# Patient Record
Sex: Female | Born: 1960
Health system: Southern US, Community
[De-identification: ages and names within clinical notes are randomized; demographics above are authoritative.]

## PROBLEM LIST (undated history)

## (undated) DIAGNOSIS — N904 Leukoplakia of vulva: Secondary | ICD-10-CM

## (undated) DIAGNOSIS — E7800 Pure hypercholesterolemia, unspecified: Secondary | ICD-10-CM

## (undated) DIAGNOSIS — N920 Excessive and frequent menstruation with regular cycle: Secondary | ICD-10-CM

## (undated) DIAGNOSIS — N800 Endometriosis of uterus: Secondary | ICD-10-CM

## (undated) DIAGNOSIS — Z8639 Personal history of other endocrine, nutritional and metabolic disease: Secondary | ICD-10-CM

## (undated) DIAGNOSIS — N8003 Adenomyosis of the uterus: Secondary | ICD-10-CM

## (undated) DIAGNOSIS — D219 Benign neoplasm of connective and other soft tissue, unspecified: Secondary | ICD-10-CM

## (undated) DIAGNOSIS — N809 Endometriosis, unspecified: Secondary | ICD-10-CM

## (undated) DIAGNOSIS — G43909 Migraine, unspecified, not intractable, without status migrainosus: Secondary | ICD-10-CM

## (undated) DIAGNOSIS — I493 Ventricular premature depolarization: Secondary | ICD-10-CM

## (undated) HISTORY — DX: Pure hypercholesterolemia, unspecified: E78.00

## (undated) HISTORY — PX: TUBAL LIGATION: SHX77

## (undated) HISTORY — DX: Benign neoplasm of connective and other soft tissue, unspecified: D21.9

## (undated) HISTORY — PX: INTRAUTERINE DEVICE (IUD) INSERTION: SHX5877

## (undated) HISTORY — DX: Endometriosis, unspecified: N80.9

## (undated) HISTORY — DX: Ventricular premature depolarization: I49.3

## (undated) HISTORY — DX: Excessive and frequent menstruation with regular cycle: N92.0

## (undated) HISTORY — DX: Personal history of other endocrine, nutritional and metabolic disease: Z86.39

## (undated) HISTORY — DX: Migraine, unspecified, not intractable, without status migrainosus: G43.909

## (undated) HISTORY — DX: Endometriosis of uterus: N80.0

## (undated) HISTORY — DX: Leukoplakia of vulva: N90.4

## (undated) HISTORY — DX: Adenomyosis of the uterus: N80.03

---

## 1991-12-14 HISTORY — PX: DILATION AND CURETTAGE OF UTERUS: SHX78

## 2004-12-13 HISTORY — PX: BREAST BIOPSY: SHX20

## 2005-12-13 HISTORY — PX: BREAST EXCISIONAL BIOPSY: SUR124

## 2009-07-02 ENCOUNTER — Ambulatory Visit: Payer: Self-pay | Admitting: Family Medicine

## 2009-07-02 DIAGNOSIS — J209 Acute bronchitis, unspecified: Secondary | ICD-10-CM

## 2009-08-15 ENCOUNTER — Encounter: Admission: RE | Admit: 2009-08-15 | Discharge: 2009-08-15 | Payer: Self-pay | Admitting: Internal Medicine

## 2009-08-29 ENCOUNTER — Encounter: Admission: RE | Admit: 2009-08-29 | Discharge: 2009-08-29 | Payer: Self-pay | Admitting: Internal Medicine

## 2010-01-26 ENCOUNTER — Ambulatory Visit (HOSPITAL_BASED_OUTPATIENT_CLINIC_OR_DEPARTMENT_OTHER): Admission: RE | Admit: 2010-01-26 | Discharge: 2010-01-26 | Payer: Self-pay | Admitting: Obstetrics and Gynecology

## 2010-11-10 DIAGNOSIS — J302 Other seasonal allergic rhinitis: Secondary | ICD-10-CM | POA: Insufficient documentation

## 2010-11-16 ENCOUNTER — Encounter: Admission: RE | Admit: 2010-11-16 | Discharge: 2010-11-16 | Payer: Self-pay | Admitting: Internal Medicine

## 2011-03-04 LAB — CBC
RBC: 4.61 MIL/uL (ref 3.87–5.11)
WBC: 8.2 10*3/uL (ref 4.0–10.5)

## 2011-03-04 LAB — HCG, SERUM, QUALITATIVE: Preg, Serum: NEGATIVE

## 2011-11-08 ENCOUNTER — Other Ambulatory Visit: Payer: Self-pay | Admitting: Internal Medicine

## 2011-11-08 DIAGNOSIS — Z1231 Encounter for screening mammogram for malignant neoplasm of breast: Secondary | ICD-10-CM

## 2011-11-23 ENCOUNTER — Ambulatory Visit
Admission: RE | Admit: 2011-11-23 | Discharge: 2011-11-23 | Disposition: A | Discharge status: Home / Self Care | Payer: Private Health Insurance - Indemnity | Source: Ambulatory Visit | Attending: Internal Medicine | Admitting: Internal Medicine

## 2011-11-23 DIAGNOSIS — Z1231 Encounter for screening mammogram for malignant neoplasm of breast: Secondary | ICD-10-CM

## 2012-10-31 ENCOUNTER — Other Ambulatory Visit: Payer: Self-pay | Admitting: Internal Medicine

## 2012-10-31 DIAGNOSIS — Z1231 Encounter for screening mammogram for malignant neoplasm of breast: Secondary | ICD-10-CM

## 2012-12-11 ENCOUNTER — Ambulatory Visit
Admission: RE | Admit: 2012-12-11 | Discharge: 2012-12-11 | Disposition: A | Discharge status: Home / Self Care | Payer: Managed Care, Other (non HMO) | Source: Ambulatory Visit | Attending: Internal Medicine | Admitting: Internal Medicine

## 2012-12-11 DIAGNOSIS — Z1231 Encounter for screening mammogram for malignant neoplasm of breast: Secondary | ICD-10-CM

## 2013-11-16 ENCOUNTER — Other Ambulatory Visit: Payer: Self-pay

## 2013-11-16 DIAGNOSIS — Z1231 Encounter for screening mammogram for malignant neoplasm of breast: Secondary | ICD-10-CM

## 2013-11-20 ENCOUNTER — Ambulatory Visit: Payer: Self-pay | Admitting: Certified Nurse Midwife

## 2013-11-22 ENCOUNTER — Encounter: Payer: Self-pay | Admitting: Certified Nurse Midwife

## 2013-11-23 ENCOUNTER — Ambulatory Visit (INDEPENDENT_AMBULATORY_CARE_PROVIDER_SITE_OTHER): Payer: BC Managed Care – PPO | Admitting: Certified Nurse Midwife

## 2013-11-23 ENCOUNTER — Encounter: Payer: Self-pay | Admitting: Certified Nurse Midwife

## 2013-11-23 VITALS — BP 110/64 | HR 64 | Resp 16 | Ht 67.5 in | Wt 233.0 lb

## 2013-11-23 DIAGNOSIS — Z01419 Encounter for gynecological examination (general) (routine) without abnormal findings: Secondary | ICD-10-CM

## 2013-11-23 DIAGNOSIS — Z800 Family history of malignant neoplasm of digestive organs: Secondary | ICD-10-CM

## 2013-11-23 NOTE — Progress Notes (Signed)
52 y.o. G46P1011 Married Caucasian Fe here for annual exam. Periods none since IUD insertion. Continues to have hip problems and needs evaluation.? Recommendation. Mother recently had Atrial fib episode with DVT and paralysis, patient would like to be proactive in preventing this for herself. Patient sees PCP for aex in 1/15. PCP does all labs. No other health issues today.  Patient's last menstrual period was 05/13/2012.          Sexually active: yes  The current method of family planning is IUD.    Exercising: no  exercise Smoker:  no  Health Maintenanceg Pap:  11-17-12 neg HPV HR net MMG:  12/13 Colonoscopy:  none BMD:   none TDaP:  Unsure will check with pcp Labs: none Self breast exam: done monthly   reports that she has never smoked. She does not have any smokeless tobacco history on file. She reports that she does not drink alcohol or use illicit drugs.  Past Medical History  Diagnosis Date  . Migraines     with aura  . Fibroid 01/13/11    2  . Menorrhagia   . Adenomyosis     Past Surgical History  Procedure Laterality Date  . Tubal ligation      BTSP  . Intrauterine device (iud) insertion      12/11  . Breast biopsy Right 2006    negative  . Dilation and curettage of uterus  1993    after SAB    Current Outpatient Prescriptions  Medication Sig Dispense Refill  . Chlorpheniramine Maleate (ALLERGY PO) Take by mouth daily.      . Cholecalciferol (VITAMIN D PO) Take by mouth daily.      . Multiple Vitamins-Minerals (MULTIVITAMIN PO) Take by mouth daily.      . naproxen sodium (ANAPROX) 220 MG tablet Take 220 mg by mouth as needed.       No current facility-administered medications for this visit.    Family History  Problem Relation Age of Onset  . Diabetes Mother   . Cancer Mother     colon  . Hypertension Mother   . Stroke Mother   . Heart disease Mother     Afib  . Hypertension Father   . Heart disease Father     CHF  . Diabetes Maternal Grandfather    . Heart attack Paternal Grandmother   . Heart attack Paternal Grandfather     ROS:  Pertinent items are noted in HPI.  Otherwise, a comprehensive ROS was negative.  Exam:   BP 110/64  Pulse 64  Resp 16  Ht 5' 7.5" (1.715 m)  Wt 233 lb (105.688 kg)  BMI 35.93 kg/m2  LMP 05/13/2012 Height: 5' 7.5" (171.5 cm)  Ht Readings from Last 3 Encounters:  11/23/13 5' 7.5" (1.715 m)  07/02/09 5\' 8"  (1.727 m)    General appearance: alert, cooperative and appears stated age Head: Normocephalic, without obvious abnormality, atraumatic Neck: no adenopathy, supple, symmetrical, trachea midline and thyroid normal to inspection and palpation and non-palpable Lungs: clear to auscultation bilaterally Breasts: normal appearance, no masses or tenderness, No nipple retraction or dimpling, No nipple discharge or bleeding, No axillary or supraclavicular adenopathy Heart: regular rate and rhythm Abdomen: soft, non-tender; no masses,  no organomegaly Extremities: extremities normal, atraumatic, no cyanosis or edema Skin: Skin color, texture, turgor normal. No rashes or lesions Lymph nodes: Cervical, supraclavicular, and axillary nodes normal. No abnormal inguinal nodes palpated Neurologic: Grossly normal   Pelvic: External genitalia:  no  lesions              Urethra:  normal appearing urethra with no masses, tenderness or lesions              Bartholin's and Skene's: normal                 Vagina: normal appearing vagina with normal color and discharge, no lesions              Cervix: normal, non tender IUD string noted              Pap taken: no Bimanual Exam:  Uterus:  normal and nodular feel posterior c/w history of fibroid.Non tender, mid position.              Adnexa: normal adnexa and no mass, fullness, tenderness               Rectovaginal: Confirms               Anus:  normal sphincter tone, no lesions  A:  Well Woman with normal exam  Contraception Mirena IUD  Right hip pain with slight  foot drag needs evaluation  Family history of DVT and CVD, patient desires evaluation  Family history of colon cancer, mother  P:   Reviewed health and wellness pertinent to exam  Removal due 11/2015  Given information to call orthopedic and schedule appointment for evaluation, if patient has problem with scheduling she will call  Patient to discuss with PCP for evaluation recommendations  Pap smear as per guidelines   Mammogram yearly pap smear not taken today Colonoscopy due, requests referral  counseled on breast self exam, mammography screening, adequate intake of calcium and vitamin D, diet and exercise  return annually or prn  An After Visit Summary was printed and given to the patient.

## 2013-11-23 NOTE — Patient Instructions (Signed)

## 2013-11-28 NOTE — Progress Notes (Signed)
Reviewed personally.  M. Suzanne Elsworth Ledin, MD.  

## 2013-12-28 ENCOUNTER — Ambulatory Visit
Admission: RE | Admit: 2013-12-28 | Discharge: 2013-12-28 | Disposition: A | Discharge status: Home / Self Care | Payer: 59 | Source: Ambulatory Visit

## 2013-12-28 DIAGNOSIS — Z1231 Encounter for screening mammogram for malignant neoplasm of breast: Secondary | ICD-10-CM

## 2014-01-01 ENCOUNTER — Other Ambulatory Visit: Payer: Self-pay | Admitting: Certified Nurse Midwife

## 2014-01-01 DIAGNOSIS — R928 Other abnormal and inconclusive findings on diagnostic imaging of breast: Secondary | ICD-10-CM

## 2014-01-08 ENCOUNTER — Other Ambulatory Visit: Payer: Self-pay | Admitting: Certified Nurse Midwife

## 2014-01-08 ENCOUNTER — Ambulatory Visit
Admission: RE | Admit: 2014-01-08 | Discharge: 2014-01-08 | Disposition: A | Discharge status: Home / Self Care | Payer: 59 | Source: Ambulatory Visit | Attending: Certified Nurse Midwife | Admitting: Certified Nurse Midwife

## 2014-01-08 DIAGNOSIS — R928 Other abnormal and inconclusive findings on diagnostic imaging of breast: Secondary | ICD-10-CM

## 2014-01-23 DIAGNOSIS — E8881 Metabolic syndrome: Secondary | ICD-10-CM | POA: Insufficient documentation

## 2014-06-07 ENCOUNTER — Other Ambulatory Visit: Payer: Self-pay | Admitting: Certified Nurse Midwife

## 2014-06-07 DIAGNOSIS — N63 Unspecified lump in unspecified breast: Secondary | ICD-10-CM

## 2014-06-20 ENCOUNTER — Other Ambulatory Visit: Payer: Self-pay

## 2014-06-20 ENCOUNTER — Other Ambulatory Visit: Payer: Self-pay | Admitting: Certified Nurse Midwife

## 2014-06-20 DIAGNOSIS — N63 Unspecified lump in unspecified breast: Secondary | ICD-10-CM

## 2014-06-21 ENCOUNTER — Ambulatory Visit
Admission: RE | Admit: 2014-06-21 | Discharge: 2014-06-21 | Disposition: A | Discharge status: Home / Self Care | Payer: 59 | Source: Ambulatory Visit | Attending: Certified Nurse Midwife | Admitting: Certified Nurse Midwife

## 2014-06-21 DIAGNOSIS — N63 Unspecified lump in unspecified breast: Secondary | ICD-10-CM

## 2014-06-28 ENCOUNTER — Encounter: Payer: Self-pay | Admitting: Certified Nurse Midwife

## 2014-07-25 DIAGNOSIS — D126 Benign neoplasm of colon, unspecified: Secondary | ICD-10-CM | POA: Insufficient documentation

## 2014-10-14 ENCOUNTER — Encounter: Payer: Self-pay | Admitting: Certified Nurse Midwife

## 2014-11-26 ENCOUNTER — Ambulatory Visit: Payer: BC Managed Care – PPO | Admitting: Certified Nurse Midwife

## 2014-12-02 ENCOUNTER — Other Ambulatory Visit: Payer: Self-pay | Admitting: Internal Medicine

## 2014-12-02 DIAGNOSIS — N6002 Solitary cyst of left breast: Secondary | ICD-10-CM

## 2014-12-30 ENCOUNTER — Ambulatory Visit
Admission: RE | Admit: 2014-12-30 | Discharge: 2014-12-30 | Disposition: A | Discharge status: Home / Self Care | Payer: 59 | Source: Ambulatory Visit | Attending: Internal Medicine | Admitting: Internal Medicine

## 2014-12-30 ENCOUNTER — Other Ambulatory Visit: Payer: Self-pay | Admitting: Internal Medicine

## 2014-12-30 DIAGNOSIS — N6002 Solitary cyst of left breast: Secondary | ICD-10-CM

## 2015-01-24 ENCOUNTER — Ambulatory Visit: Payer: BC Managed Care – PPO | Admitting: Certified Nurse Midwife

## 2015-04-10 ENCOUNTER — Ambulatory Visit: Payer: Self-pay | Admitting: Certified Nurse Midwife

## 2015-04-24 ENCOUNTER — Ambulatory Visit (INDEPENDENT_AMBULATORY_CARE_PROVIDER_SITE_OTHER): Payer: 59 | Admitting: Certified Nurse Midwife

## 2015-04-24 ENCOUNTER — Encounter: Payer: Self-pay | Admitting: Certified Nurse Midwife

## 2015-04-24 VITALS — BP 100/62 | HR 74 | Resp 20 | Ht 67.5 in | Wt 226.0 lb

## 2015-04-24 DIAGNOSIS — Z124 Encounter for screening for malignant neoplasm of cervix: Secondary | ICD-10-CM

## 2015-04-24 DIAGNOSIS — Z01419 Encounter for gynecological examination (general) (routine) without abnormal findings: Secondary | ICD-10-CM | POA: Diagnosis not present

## 2015-04-24 NOTE — Patient Instructions (Signed)

## 2015-04-24 NOTE — Progress Notes (Addendum)
54 y.o. G25P1011 Married  Caucasian Fe here for annual exam. Perimenopausal with Mirena IUD, no periods at all. Working well for cycle control. Due for removal in 12/16. If not menopausal would consider removal and reinsertion. Migraines with aura are better. Social concerns with mother's long term disability with stroke. Sees Dr Sherian Maroon for aex/ labs/ medication management for hypertension and cholesterol. No health issues today. Just returned for [place] for vacation!  No LMP recorded. Patient is not currently having periods (Reason: IUD).    05/13/2012      Sexually active: Yes.    The current method of family planning is IUD & BTL.    Exercising: No.  exercise Smoker:  no  Health Maintenance: Pap:  11-17-12 neg HPV HR neg [id number] & u/s 12-30-14 category b density, birads 3 prob benign Colonoscopy: 2015 BMD:   none TDaP:  Unsure to check with pcp Labs: none Self breast exam: done monthly   reports that she has never smoked. She does not have any smokeless tobacco history on file. She reports that she does not drink alcohol or use illicit drugs.  Past Medical History  Diagnosis Date  . Migraines     with aura  . Fibroid 01/13/11    2  . Menorrhagia   . Adenomyosis     Past Surgical History  Procedure Laterality Date  . Tubal ligation      BTSP  . Intrauterine device (iud) insertion      12/11  . Breast biopsy Right 2006    negative  . Dilation and curettage of uterus  1993    after SAB    Current Outpatient Prescriptions  Medication Sig Dispense Refill  . atorvastatin (LIPITOR) 10 MG tablet Take 10 mg by mouth daily.  5  . levocetirizine (XYZAL) 5 MG tablet Take 5 mg by mouth daily.  5  . Multiple Vitamins-Minerals (MULTIVITAMIN PO) Take by mouth daily.     No current facility-administered medications for this visit.    Family History  Problem Relation Age of Onset  . Diabetes Mother   . Cancer Mother     colon  . Hypertension Mother   . Stroke Mother    . Heart disease Mother     Afib  . Hypertension Father   . Heart disease Father     CHF  . Diabetes Maternal Grandfather   . Heart attack Paternal Grandmother   . Heart attack Paternal Grandfather     ROS:  Pertinent items are noted in HPI.  Otherwise, a comprehensive ROS was negative.  Exam:   BP 100/62 mmHg  Pulse 74  Resp 20  Ht 5' 7.5" (1.715 m)  Wt 226 lb (102.513 kg)  BMI 34.85 kg/m2 Height: 5' 7.5" (171.5 cm) Ht Readings from Last 3 Encounters:  01/02/16 5' 7.5" (1.715 m)  04/24/15 5' 7.5" (1.715 m)  11/23/13 5' 7.5" (1.715 m)    General appearance: alert, cooperative and appears stated age Head: Normocephalic, without obvious abnormality, atraumatic Neck: no adenopathy, supple, symmetrical, trachea midline and thyroid normal to inspection and palpation Lungs: clear to auscultation bilaterally Breasts: normal appearance, no masses or tenderness, No nipple retraction or dimpling, No nipple discharge or bleeding, No axillary or supraclavicular adenopathy Heart: regular rate and rhythm Abdomen: soft, non-tender; no masses,  no organomegaly Extremities: extremities normal, atraumatic, no cyanosis or edema Skin: Skin color, texture, turgor normal. No rashes or lesions Lymph nodes: Cervical, supraclavicular, and axillary nodes normal. No abnormal inguinal nodes  palpated Neurologic: Grossly normal   Pelvic: External genitalia:  no lesions              Urethra:  normal appearing urethra with no masses, tenderness or lesions              Bartholin's and Skene's: normal                 Vagina: normal appearing vagina with normal color and discharge, no lesions              Cervix: normal, non tender, no lesions, IUD string noted in cervix              Pap taken: Yes.   Bimanual Exam:  Uterus:  normal with nodular feel posterior consistent with history of fibroid, non tender              Adnexa: normal adnexa and no mass, fullness, tenderness               Rectovaginal:  Confirms               Anus:  normal sphincter tone, no lesions  Chaperone present: Yes  A:  Well Woman with normal exam  Mirena UD for cycle control only due for   Hypertension/cholesterol with PCP management  Immunization due  P:   Reviewed health and wellness pertinent to exam  Discussed coming in 11/16 for [place] to see if menopausal, if not patient may want new IUD inserted, will advise  Continue MD follow up as indicated  Declines TDAP today  Pap smear taken today with HPVHR   counseled on breast self exam, mammography screening, adequate intake of calcium and vitamin D, diet and exercise, Kegel's exercises  return annually or prn  An After Visit Summary was printed and given to the patient.

## 2015-04-28 LAB — IPS PAP TEST WITH HPV

## 2015-04-28 NOTE — Progress Notes (Signed)
Reviewed personally.  M. Suzanne Tegan Burnside, MD.  

## 2015-11-03 ENCOUNTER — Other Ambulatory Visit (INDEPENDENT_AMBULATORY_CARE_PROVIDER_SITE_OTHER): Payer: 59

## 2015-11-03 ENCOUNTER — Telehealth: Payer: Self-pay | Admitting: Certified Nurse Midwife

## 2015-11-03 DIAGNOSIS — N912 Amenorrhea, unspecified: Secondary | ICD-10-CM

## 2015-11-03 NOTE — Telephone Encounter (Signed)
Patient calling to schedule IUD removal and possible re-insertion. She also said, "I may not need another one if my hormones show I am in menopause."

## 2015-11-03 NOTE — Telephone Encounter (Signed)
Spoke with patient. Patient was last seen on 04/24/2015 with Kerri Dunn CNM for aex. Mirena was inserted 11/2010 due for removal 11/2015. Per note patient will need [place] level checked prior to removal to see if reinsertion is warranted at this time. Lab appointment scheduled for today 11/21 at 1:30 pm. Agreeable to date and time. [facility] lab order placed.  Routing to covering provider for final review. Patient agreeable to disposition. Will close encounter.

## 2015-11-04 LAB — FOLLICLE STIMULATING HORMONE: FSH: 84.4 m[IU]/mL

## 2015-11-05 ENCOUNTER — Telehealth: Payer: Self-pay | Admitting: Emergency Medicine

## 2015-11-05 DIAGNOSIS — Z30432 Encounter for removal of intrauterine contraceptive device: Secondary | ICD-10-CM

## 2015-11-05 NOTE — Telephone Encounter (Signed)
-----   Message from Regina Eck, CNM sent at 11/04/2015  2:28 PM EST ----- Notify patient that her [facility] shows menopausal range, she may or may not have bleeding again with this level. She is due for IUD removal in 12/16, if we are concerned about fertility we could do lab [facility] if negative no contraception concerns. Likely she will not have another period with her IUD. If she wants removal please advise and schedule

## 2015-11-11 NOTE — Telephone Encounter (Signed)
Message left to return call to Kerri Dunn at [phone].    

## 2015-11-25 NOTE — Telephone Encounter (Signed)
Patient returned call and message from Melvia Heaps CNM given.  She states she has had a tubal ligation and was using Mirena to help with heavy periods and cramps. Discussed possible fluctuations of FSH level. Patient would like to know Debbi's recommendation if IUD should be removed and replaced or removed only.

## 2015-11-28 NOTE — Telephone Encounter (Signed)
Notify patient that even though IUD due for removal, she is not using for contraception so not as concerned about removing quickly. Her [place] is indicative of menopause and could have bleeding with removal, but doubtful. I would recommend removal in 1/17. If bleeding occurs or hot flashes we can always re-insert IUD if needed. Most women who do not bleed with their IUD, do bleed after removal. No way to be sure what she will do.

## 2015-11-28 NOTE — Telephone Encounter (Signed)
Patient contacted and message from Melvia Heaps CNM discussed. Patient agreeable. IUD removal scheduled at patient's preference for date, 12/26/15 at 1400.  Order placed for pre-certification.   cc [place] for insurance pre-certification and patient contact.

## 2015-12-03 ENCOUNTER — Telehealth: Payer: Self-pay | Admitting: Certified Nurse Midwife

## 2015-12-03 NOTE — Telephone Encounter (Signed)
Left patient a message to call back to reschedule a future appointment that was cancelled by the provider. Patient picked up the call and is aware her appointment for 12/26/15 with Melvia Heaps, CNM has been cancelled due to a schedule conflict. She will call back to reschedule.

## 2015-12-04 NOTE — Telephone Encounter (Signed)
Spoke pt with patient. regarding benefit for IUD Removal. Patient understood and agreeable. Patient rescheduled for 01/02/16 with D. Hollice Espy. Pt aware of arrival date and time. Pt aware of 72 hours cancellation policy with [id number] fee. No further questions. Ok to close

## 2015-12-26 ENCOUNTER — Ambulatory Visit: Payer: 59 | Admitting: Certified Nurse Midwife

## 2016-01-02 ENCOUNTER — Ambulatory Visit (INDEPENDENT_AMBULATORY_CARE_PROVIDER_SITE_OTHER): Payer: 59 | Admitting: Certified Nurse Midwife

## 2016-01-02 ENCOUNTER — Encounter: Payer: Self-pay | Admitting: Certified Nurse Midwife

## 2016-01-02 DIAGNOSIS — Z30432 Encounter for removal of intrauterine contraceptive device: Secondary | ICD-10-CM

## 2016-01-02 NOTE — Addendum Note (Signed)
Addended by: Regina Eck on: 01/02/2016 02:23 PM   Modules accepted: Miquel Dunn

## 2016-01-02 NOTE — Progress Notes (Signed)
53 yrs Caucasian Married G2P1011 LMP 12/13/13.  Presents for Mirena removal.  Denies any vaginal symptoms or STD concerns.  Plans for contraception are  patient had BTL and is menopausal. Patient was using for cycle control only. Patient verbally requests IUD removal. Having hot flashes and night sweats, but tolerating without problems. No health concerns today.  LMP 12/13/13        HPI neg. Affect: Normal, orientation x 3  Exam:  Abdomen: soft non-tender Groin:no inguinal nodes palpated    Pelvic exam:Pelvic exam: VULVA: normal appearing vulva with no masses, tenderness or lesions, VAGINA: normal appearing vagina with normal color and discharge, no lesions, CERVIX: normal appearing cervix without discharge or lesions, IUD string noted in cervix,  UTERUS: uterus is normal size, shape, consistency and nontender, ADNEXA: normal adnexa in size, nontender and no masses.  Procedure: Speculum placed, cervix visualized.  IUD string visualized, grasp with ring forceps, with gentle traction IUD removed intact.  IUD shown to patient and discarded. Speculum removed.   Assessment:Mirena removal Pt tolerated procedure well.  Plan: Discussed expectations with menopause and IUD removal. May or may not have bleeding due to removal. Patient will advise if occurs. If any vaginal bleeding after initial time from removal needs to be reported. Patient agreeable. Questions addressed.  RV aex 5/17

## 2016-01-05 NOTE — Progress Notes (Signed)
Reviewed personally.  M. Suzanne Coleman Kalas, MD.  

## 2016-01-09 ENCOUNTER — Other Ambulatory Visit: Payer: Self-pay | Admitting: Internal Medicine

## 2016-01-09 DIAGNOSIS — N6002 Solitary cyst of left breast: Secondary | ICD-10-CM

## 2016-01-19 ENCOUNTER — Ambulatory Visit
Admission: RE | Admit: 2016-01-19 | Discharge: 2016-01-19 | Disposition: A | Discharge status: Home / Self Care | Payer: 59 | Source: Ambulatory Visit | Attending: Internal Medicine | Admitting: Internal Medicine

## 2016-01-19 DIAGNOSIS — N6002 Solitary cyst of left breast: Secondary | ICD-10-CM

## 2016-03-12 DIAGNOSIS — E119 Type 2 diabetes mellitus without complications: Secondary | ICD-10-CM | POA: Insufficient documentation

## 2016-05-05 ENCOUNTER — Ambulatory Visit: Payer: 59 | Admitting: Certified Nurse Midwife

## 2016-05-21 ENCOUNTER — Encounter: Payer: Self-pay | Admitting: Certified Nurse Midwife

## 2016-05-21 ENCOUNTER — Ambulatory Visit (INDEPENDENT_AMBULATORY_CARE_PROVIDER_SITE_OTHER): Payer: 59 | Admitting: Certified Nurse Midwife

## 2016-05-21 VITALS — BP 106/70 | HR 70 | Resp 16 | Ht 67.5 in | Wt 234.0 lb

## 2016-05-21 DIAGNOSIS — Z01419 Encounter for gynecological examination (general) (routine) without abnormal findings: Secondary | ICD-10-CM | POA: Diagnosis not present

## 2016-05-21 DIAGNOSIS — Z23 Encounter for immunization: Secondary | ICD-10-CM

## 2016-05-21 NOTE — Patient Instructions (Signed)

## 2016-05-21 NOTE — Progress Notes (Signed)
Reviewed personally.  M. Suzanne Nathanial Arrighi, MD.  

## 2016-05-21 NOTE — Progress Notes (Signed)
55 y.o. G29P1011 Married  Caucasian Fe here for annual exam. Menopausal with occasional hot flashes, no issues. Denies any bleeding or vaginal dryness. Sees Dr. Osborne Casco for aex,labs and cholesterol management. Had no bleeding after IUD removal. Working on weight loss portion control. No health concerns today. Just returned from [place]   Patient's last menstrual period was 12/14/2011.          Sexually active: Yes.    The current method of family planning is tubal ligation.    Exercising: No.  exercise Smoker:  no  Health Maintenance: Pap: 04-24-15 neg HPV HR neg MMG:  01-19-16 category b density birads 2:neg Colonoscopy:  2015 negative BMD:   none TDaP: unknown Shingles: no Pneumonia: no Hep C and HIV: both done when married Labs: pcp Self breast exam: done monthly   reports that she has never smoked. She does not have any smokeless tobacco history on file. She reports that she does not drink alcohol or use illicit drugs.  Past Medical History  Diagnosis Date  . Migraines     with aura  . Fibroid 01/13/11    2  . Menorrhagia   . Adenomyosis     Past Surgical History  Procedure Laterality Date  . Tubal ligation      BTSP  . Intrauterine device (iud) insertion      12/11  . Breast biopsy Right 2006    negative  . Dilation and curettage of uterus  1993    after SAB    Current Outpatient Prescriptions  Medication Sig Dispense Refill  . atorvastatin (LIPITOR) 10 MG tablet Take 10 mg by mouth daily.  5  . levocetirizine (XYZAL) 5 MG tablet Take 5 mg by mouth daily.  5  . Multiple Vitamins-Minerals (MULTIVITAMIN PO) Take by mouth daily.     No current facility-administered medications for this visit.    Family History  Problem Relation Age of Onset  . Diabetes Mother   . Cancer Mother     colon  . Hypertension Mother   . Stroke Mother   . Heart disease Mother     Afib  . Hypertension Father   . Heart disease Father     CHF  . Diabetes Maternal  Grandfather   . Heart attack Paternal Grandmother   . Heart attack Paternal Grandfather     ROS:  Pertinent items are noted in HPI.  Otherwise, a comprehensive ROS was negative.  Exam:   BP 106/70 mmHg  Pulse 70  Resp 16  Ht 5' 7.5" (1.715 m)  Wt 234 lb (106.142 kg)  BMI 36.09 kg/m2  LMP 12/14/2011 Height: 5' 7.5" (171.5 cm) Ht Readings from Last 3 Encounters:  05/21/16 5' 7.5" (1.715 m)  01/02/16 5' 7.5" (1.715 m)  04/24/15 5' 7.5" (1.715 m)    General appearance: alert, cooperative and appears stated age Head: Normocephalic, without obvious abnormality, atraumatic Neck: no adenopathy, supple, symmetrical, trachea midline and thyroid normal to inspection and palpation Lungs: clear to auscultation bilaterally Breasts: normal appearance, no masses or tenderness, No nipple retraction or dimpling, No nipple discharge or bleeding, No axillary or supraclavicular adenopathy Heart: regular rate and rhythm Abdomen: soft, non-tender; no masses,  no organomegaly Extremities: extremities normal, atraumatic, no cyanosis or edema Skin: Skin color, texture, turgor normal. No rashes or lesions Lymph nodes: Cervical, supraclavicular, and axillary nodes normal. No abnormal inguinal nodes palpated Neurologic: Grossly normal   Pelvic: External genitalia:  no lesions  Urethra:  normal appearing urethra with no masses, tenderness or lesions              Bartholin's and Skene's: normal                 Vagina: normal appearing vagina with normal color and discharge, no lesions              Cervix: no cervical motion tenderness, no lesions and normal appearance              Pap taken: No. Bimanual Exam:  Uterus:  normal size, contour, position, consistency, mobility, non-tender              Adnexa: normal adnexa and no mass, fullness, tenderness, limited by body habitus               Rectovaginal: Confirms               Anus:  normal sphincter tone, no lesions  Chaperone present:  yes  A:  Well Woman with normal exam  Menopausal no HRT  Cholesterol with PCP  Immunization due  P:   Reviewed health and wellness pertinent to exam  Aware of need to evaluate if vaginal bleeding  Continue with PCP as indicated  Requests TDAP  Pap smear as above not taken   counseled on breast self exam, mammography screening, menopause, adequate intake of calcium and vitamin D, diet and exercise  return annually or prn  An After Visit Summary was printed and given to the patient.  Patient here for AEX, due for Tdap. Injection given in right deltoid. Patient tolerated injection well.

## 2017-01-05 DIAGNOSIS — M9903 Segmental and somatic dysfunction of lumbar region: Secondary | ICD-10-CM | POA: Diagnosis not present

## 2017-01-05 DIAGNOSIS — M47816 Spondylosis without myelopathy or radiculopathy, lumbar region: Secondary | ICD-10-CM | POA: Diagnosis not present

## 2017-01-06 DIAGNOSIS — M47816 Spondylosis without myelopathy or radiculopathy, lumbar region: Secondary | ICD-10-CM | POA: Diagnosis not present

## 2017-01-06 DIAGNOSIS — M9903 Segmental and somatic dysfunction of lumbar region: Secondary | ICD-10-CM | POA: Diagnosis not present

## 2017-01-10 DIAGNOSIS — M47816 Spondylosis without myelopathy or radiculopathy, lumbar region: Secondary | ICD-10-CM | POA: Diagnosis not present

## 2017-01-10 DIAGNOSIS — M9903 Segmental and somatic dysfunction of lumbar region: Secondary | ICD-10-CM | POA: Diagnosis not present

## 2017-01-12 DIAGNOSIS — M9903 Segmental and somatic dysfunction of lumbar region: Secondary | ICD-10-CM | POA: Diagnosis not present

## 2017-01-12 DIAGNOSIS — M47816 Spondylosis without myelopathy or radiculopathy, lumbar region: Secondary | ICD-10-CM | POA: Diagnosis not present

## 2017-01-17 DIAGNOSIS — M47816 Spondylosis without myelopathy or radiculopathy, lumbar region: Secondary | ICD-10-CM | POA: Diagnosis not present

## 2017-01-17 DIAGNOSIS — M9903 Segmental and somatic dysfunction of lumbar region: Secondary | ICD-10-CM | POA: Diagnosis not present

## 2017-01-19 DIAGNOSIS — M47816 Spondylosis without myelopathy or radiculopathy, lumbar region: Secondary | ICD-10-CM | POA: Diagnosis not present

## 2017-01-19 DIAGNOSIS — M9903 Segmental and somatic dysfunction of lumbar region: Secondary | ICD-10-CM | POA: Diagnosis not present

## 2017-01-24 DIAGNOSIS — M47816 Spondylosis without myelopathy or radiculopathy, lumbar region: Secondary | ICD-10-CM | POA: Diagnosis not present

## 2017-01-24 DIAGNOSIS — M9903 Segmental and somatic dysfunction of lumbar region: Secondary | ICD-10-CM | POA: Diagnosis not present

## 2017-01-31 DIAGNOSIS — M47816 Spondylosis without myelopathy or radiculopathy, lumbar region: Secondary | ICD-10-CM | POA: Diagnosis not present

## 2017-01-31 DIAGNOSIS — M9903 Segmental and somatic dysfunction of lumbar region: Secondary | ICD-10-CM | POA: Diagnosis not present

## 2017-02-02 ENCOUNTER — Other Ambulatory Visit: Payer: Self-pay | Admitting: Certified Nurse Midwife

## 2017-02-02 DIAGNOSIS — M9903 Segmental and somatic dysfunction of lumbar region: Secondary | ICD-10-CM | POA: Diagnosis not present

## 2017-02-02 DIAGNOSIS — M47816 Spondylosis without myelopathy or radiculopathy, lumbar region: Secondary | ICD-10-CM | POA: Diagnosis not present

## 2017-02-02 DIAGNOSIS — Z1231 Encounter for screening mammogram for malignant neoplasm of breast: Secondary | ICD-10-CM

## 2017-02-08 DIAGNOSIS — M47816 Spondylosis without myelopathy or radiculopathy, lumbar region: Secondary | ICD-10-CM | POA: Diagnosis not present

## 2017-02-08 DIAGNOSIS — M9903 Segmental and somatic dysfunction of lumbar region: Secondary | ICD-10-CM | POA: Diagnosis not present

## 2017-02-09 DIAGNOSIS — M9903 Segmental and somatic dysfunction of lumbar region: Secondary | ICD-10-CM | POA: Diagnosis not present

## 2017-02-09 DIAGNOSIS — M47816 Spondylosis without myelopathy or radiculopathy, lumbar region: Secondary | ICD-10-CM | POA: Diagnosis not present

## 2017-02-14 DIAGNOSIS — M47816 Spondylosis without myelopathy or radiculopathy, lumbar region: Secondary | ICD-10-CM | POA: Diagnosis not present

## 2017-02-14 DIAGNOSIS — M9903 Segmental and somatic dysfunction of lumbar region: Secondary | ICD-10-CM | POA: Diagnosis not present

## 2017-02-16 ENCOUNTER — Encounter: Payer: Self-pay | Admitting: Radiology

## 2017-02-16 ENCOUNTER — Ambulatory Visit
Admission: RE | Admit: 2017-02-16 | Discharge: 2017-02-16 | Disposition: A | Discharge status: Home / Self Care | Payer: 59 | Source: Ambulatory Visit | Attending: Certified Nurse Midwife | Admitting: Certified Nurse Midwife

## 2017-02-16 DIAGNOSIS — Z1231 Encounter for screening mammogram for malignant neoplasm of breast: Secondary | ICD-10-CM

## 2017-02-16 DIAGNOSIS — M9903 Segmental and somatic dysfunction of lumbar region: Secondary | ICD-10-CM | POA: Diagnosis not present

## 2017-02-16 DIAGNOSIS — M47816 Spondylosis without myelopathy or radiculopathy, lumbar region: Secondary | ICD-10-CM | POA: Diagnosis not present

## 2017-02-22 DIAGNOSIS — M9903 Segmental and somatic dysfunction of lumbar region: Secondary | ICD-10-CM | POA: Diagnosis not present

## 2017-02-22 DIAGNOSIS — M47816 Spondylosis without myelopathy or radiculopathy, lumbar region: Secondary | ICD-10-CM | POA: Diagnosis not present

## 2017-03-17 DIAGNOSIS — Z0000 Encounter for general adult medical examination without abnormal findings: Secondary | ICD-10-CM | POA: Diagnosis not present

## 2017-03-24 DIAGNOSIS — E119 Type 2 diabetes mellitus without complications: Secondary | ICD-10-CM | POA: Diagnosis not present

## 2017-03-24 DIAGNOSIS — D126 Benign neoplasm of colon, unspecified: Secondary | ICD-10-CM | POA: Diagnosis not present

## 2017-03-24 DIAGNOSIS — Z1389 Encounter for screening for other disorder: Secondary | ICD-10-CM | POA: Diagnosis not present

## 2017-03-24 DIAGNOSIS — Z0000 Encounter for general adult medical examination without abnormal findings: Secondary | ICD-10-CM | POA: Diagnosis not present

## 2017-03-31 DIAGNOSIS — Z1212 Encounter for screening for malignant neoplasm of rectum: Secondary | ICD-10-CM | POA: Diagnosis not present

## 2017-05-25 ENCOUNTER — Other Ambulatory Visit (HOSPITAL_COMMUNITY)
Admission: RE | Admit: 2017-05-25 | Discharge: 2017-05-25 | Disposition: A | Discharge status: Home / Self Care | Payer: 59 | Source: Ambulatory Visit | Attending: Obstetrics & Gynecology | Admitting: Obstetrics & Gynecology

## 2017-05-25 ENCOUNTER — Encounter: Payer: Self-pay | Admitting: Certified Nurse Midwife

## 2017-05-25 ENCOUNTER — Ambulatory Visit (INDEPENDENT_AMBULATORY_CARE_PROVIDER_SITE_OTHER): Payer: 59 | Admitting: Certified Nurse Midwife

## 2017-05-25 VITALS — BP 112/80 | HR 70 | Resp 16 | Ht 67.5 in | Wt 231.0 lb

## 2017-05-25 DIAGNOSIS — Z01419 Encounter for gynecological examination (general) (routine) without abnormal findings: Secondary | ICD-10-CM

## 2017-05-25 DIAGNOSIS — Z124 Encounter for screening for malignant neoplasm of cervix: Secondary | ICD-10-CM

## 2017-05-25 DIAGNOSIS — N951 Menopausal and female climacteric states: Secondary | ICD-10-CM | POA: Diagnosis not present

## 2017-05-25 NOTE — Progress Notes (Signed)
56 y.o. G82P1011 Married  Caucasian Fe here for annual exam. Menopausal hot flashes are weaning down no issues. Denies vaginal bleeding or vaginal dryness. Sees Dr.Tisovec for aex/labs and cholesterol management. All labs normal per patient. 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 Working on starting exercise again, had back issues previously. No other health issues today. Recent visit to [place]  Patient's last menstrual period was 11/27/2012.          Sexually active: Yes.    The current method of family planning is tubal ligation.    Exercising: No.  exercise Smoker:  no  Health Maintenance: Pap:  04-24-15 neg HPV HR neg History of Abnormal Pap: no MMG:  02-16-17 category a density birads 1:neg Self Breast exams: yes Colonoscopy:  2015 polyps f/u 33yrs BMD:   none TDaP:  2017 Shingles: no Pneumonia: no Hep C and HIV: both done when married-neg Labs: none   reports that she has never smoked. She has never used smokeless tobacco. She reports that she drinks alcohol. She reports that she does not use drugs.  Past Medical History:  Diagnosis Date  . Adenomyosis   . Fibroid 01/13/11   2  . Menorrhagia   . Migraines    with aura    Past Surgical History:  Procedure Laterality Date  . BREAST BIOPSY Right 2006   negative  . BREAST EXCISIONAL BIOPSY Right 2007  . DILATION AND CURETTAGE OF UTERUS  1993   after SAB  . INTRAUTERINE DEVICE (IUD) INSERTION     12/11  . TUBAL LIGATION     BTSP    Current Outpatient Prescriptions  Medication Sig Dispense Refill  . atorvastatin (LIPITOR) 20 MG tablet Take 20 mg by mouth daily.  3  . levocetirizine (XYZAL) 5 MG tablet Take 5 mg by mouth daily.  5  . Multiple Vitamins-Minerals (MULTIVITAMIN PO) Take by mouth daily.     No current facility-administered medications for this visit.     Family History  Problem Relation Age of Onset  . Hypertension Father   . Heart disease Father        CHF  . Diabetes Mother    . Cancer Mother        colon  . Hypertension Mother   . Stroke Mother   . Heart disease Mother        Afib  . Skin cancer Mother   . Diabetes Maternal Grandfather   . Heart attack Paternal Grandmother   . Heart attack Paternal Grandfather     ROS:  Pertinent items are noted in HPI.  Otherwise, a comprehensive ROS was negative.  Exam:   BP 112/80   Pulse 70   Resp 16   Ht 5' 7.5" (1.715 m)   Wt 231 lb (104.8 kg)   LMP 11/27/2012   BMI 35.65 kg/m  Height: 5' 7.5" (171.5 cm) Ht Readings from Last 3 Encounters:  05/25/17 5' 7.5" (1.715 m)  05/21/16 5' 7.5" (1.715 m)  01/02/16 5' 7.5" (1.715 m)    General appearance: alert, cooperative and appears stated age Head: Normocephalic, without obvious abnormality, atraumatic Neck: no adenopathy, supple, symmetrical, trachea midline and thyroid normal to inspection and palpation Lungs: clear to auscultation bilaterally Breasts: normal appearance, no masses or tenderness, No nipple retraction or dimpling, No nipple discharge or bleeding, No axillary or supraclavicular adenopathy Heart: regular rate and rhythm Abdomen: soft, non-tender; no masses,  no organomegaly Extremities: extremities normal, atraumatic, no cyanosis or edema  Skin: Skin color, texture, turgor normal. No rashes or lesions Lymph nodes: Cervical, supraclavicular, and axillary nodes normal. No abnormal inguinal nodes palpated Neurologic: Grossly normal   Pelvic: External genitalia:  no lesions              Urethra:  normal appearing urethra with no masses, tenderness or lesions              Bartholin's and Skene's: normal                 Vagina: normal appearing vagina with normal color and discharge, no lesions              Cervix: no cervical motion tenderness and no lesions              Pap taken: Yes.   Bimanual Exam:  Uterus:  normal size, contour, position, consistency, mobility, non-tender              Adnexa: normal adnexa and no mass, fullness,  tenderness               Rectovaginal: Confirms               Anus:  normal sphincter tone, no lesions  Chaperone present: yes  A:  Well Woman with normal exam  Menopausal no HRT  Dermatology information for skin check  Cholesterol management per PCP  P:   Reviewed health and wellness pertinent to exam  Aware of need to evaluate if vaginal bleeding  Given [facility] Dermatology information  Continue follow up with PCP as indicated  Pap smear: yes   counseled on breast self exam, mammography screening, adequate intake of calcium and vitamin D, diet and exercise return annually or prn  An After Visit Summary was printed and given to the patient.

## 2017-05-25 NOTE — Patient Instructions (Addendum)

## 2017-05-26 LAB — CYTOLOGY - PAP: Diagnosis: NEGATIVE

## 2018-05-03 ENCOUNTER — Other Ambulatory Visit: Payer: Self-pay | Admitting: Certified Nurse Midwife

## 2018-05-03 DIAGNOSIS — Z1231 Encounter for screening mammogram for malignant neoplasm of breast: Secondary | ICD-10-CM

## 2018-05-26 ENCOUNTER — Ambulatory Visit: Payer: 59 | Admitting: Certified Nurse Midwife

## 2018-05-29 ENCOUNTER — Ambulatory Visit
Admission: RE | Admit: 2018-05-29 | Discharge: 2018-05-29 | Disposition: A | Discharge status: Home / Self Care | Payer: 59 | Source: Ambulatory Visit | Attending: Certified Nurse Midwife | Admitting: Certified Nurse Midwife

## 2018-05-29 DIAGNOSIS — Z1231 Encounter for screening mammogram for malignant neoplasm of breast: Secondary | ICD-10-CM | POA: Diagnosis not present

## 2018-06-29 DIAGNOSIS — Z0000 Encounter for general adult medical examination without abnormal findings: Secondary | ICD-10-CM | POA: Diagnosis not present

## 2018-06-29 DIAGNOSIS — R82998 Other abnormal findings in urine: Secondary | ICD-10-CM | POA: Diagnosis not present

## 2018-07-06 DIAGNOSIS — E119 Type 2 diabetes mellitus without complications: Secondary | ICD-10-CM | POA: Diagnosis not present

## 2018-07-06 DIAGNOSIS — D126 Benign neoplasm of colon, unspecified: Secondary | ICD-10-CM | POA: Diagnosis not present

## 2018-07-06 DIAGNOSIS — Z1389 Encounter for screening for other disorder: Secondary | ICD-10-CM | POA: Diagnosis not present

## 2018-07-06 DIAGNOSIS — Z0000 Encounter for general adult medical examination without abnormal findings: Secondary | ICD-10-CM | POA: Diagnosis not present

## 2018-07-18 DIAGNOSIS — Z1212 Encounter for screening for malignant neoplasm of rectum: Secondary | ICD-10-CM | POA: Diagnosis not present

## 2018-08-15 DIAGNOSIS — J329 Chronic sinusitis, unspecified: Secondary | ICD-10-CM | POA: Diagnosis not present

## 2018-08-15 DIAGNOSIS — J40 Bronchitis, not specified as acute or chronic: Secondary | ICD-10-CM | POA: Diagnosis not present

## 2018-08-15 DIAGNOSIS — R05 Cough: Secondary | ICD-10-CM | POA: Diagnosis not present

## 2018-08-15 DIAGNOSIS — J111 Influenza due to unidentified influenza virus with other respiratory manifestations: Secondary | ICD-10-CM | POA: Diagnosis not present

## 2018-09-21 ENCOUNTER — Ambulatory Visit: Payer: 59 | Admitting: Certified Nurse Midwife

## 2018-09-27 ENCOUNTER — Encounter: Payer: Self-pay | Admitting: Certified Nurse Midwife

## 2018-09-27 ENCOUNTER — Ambulatory Visit (INDEPENDENT_AMBULATORY_CARE_PROVIDER_SITE_OTHER): Payer: 59 | Admitting: Certified Nurse Midwife

## 2018-09-27 ENCOUNTER — Other Ambulatory Visit: Payer: Self-pay

## 2018-09-27 VITALS — BP 110/64 | HR 68 | Resp 16 | Ht 67.75 in | Wt 240.0 lb

## 2018-09-27 DIAGNOSIS — N951 Menopausal and female climacteric states: Secondary | ICD-10-CM | POA: Diagnosis not present

## 2018-09-27 DIAGNOSIS — Z01419 Encounter for gynecological examination (general) (routine) without abnormal findings: Secondary | ICD-10-CM | POA: Diagnosis not present

## 2018-09-27 DIAGNOSIS — E663 Overweight: Secondary | ICD-10-CM | POA: Diagnosis not present

## 2018-09-27 DIAGNOSIS — N898 Other specified noninflammatory disorders of vagina: Secondary | ICD-10-CM | POA: Diagnosis not present

## 2018-09-27 NOTE — Patient Instructions (Signed)
EXERCISE AND DIET:  We recommended that you start or continue a regular exercise program for good health. Regular exercise means any activity that makes your heart beat faster and makes you sweat.  We recommend exercising at least 30 minutes per day at least 3 days a week, preferably 4 or 5.  We also recommend a diet low in fat and sugar.  Inactivity, poor dietary choices and obesity can cause diabetes, heart attack, stroke, and kidney damage, among others.    ALCOHOL AND SMOKING:  Women should limit their alcohol intake to no more than 7 drinks/beers/glasses of wine (combined, not each!) per week. Moderation of alcohol intake to this level decreases your risk of breast cancer and liver damage. And of course, no recreational drugs are part of a healthy lifestyle.  And absolutely no smoking or even second hand smoke. Most people know smoking can cause heart and lung diseases, but did you know it also contributes to weakening of your bones? Aging of your skin?  Yellowing of your teeth and nails?  CALCIUM AND VITAMIN D:  Adequate intake of calcium and Vitamin D are recommended.  The recommendations for exact amounts of these supplements seem to change often, but generally speaking 600 mg of calcium (either carbonate or citrate) and 800 units of Vitamin D per day seems prudent. Certain women may benefit from higher intake of Vitamin D.  If you are among these women, your doctor will have told you during your visit.    PAP SMEARS:  Pap smears, to check for cervical cancer or precancers,  have traditionally been done yearly, although recent scientific advances have shown that most women can have pap smears less often.  However, every woman still should have a physical exam from her gynecologist every year. It will include a breast check, inspection of the vulva and vagina to check for abnormal growths or skin changes, a visual exam of the cervix, and then an exam to evaluate the size and shape of the uterus and  ovaries.  And after 55 years of age, a rectal exam is indicated to check for rectal cancers. We will also provide age appropriate advice regarding health maintenance, like when you should have certain vaccines, screening for sexually transmitted diseases, bone density testing, colonoscopy, mammograms, etc.   MAMMOGRAMS:  All women over 40 years old should have a yearly mammogram. Many facilities now offer a "3D" mammogram, which may cost around $50 extra out of pocket. If possible,  we recommend you accept the option to have the 3D mammogram performed.  It both reduces the number of women who will be called back for extra views which then turn out to be normal, and it is better than the routine mammogram at detecting truly abnormal areas.    COLONOSCOPY:  Colonoscopy to screen for colon cancer is recommended for all women at age 50.  We know, you hate the idea of the prep.  We agree, BUT, having colon cancer and not knowing it is worse!!  Colon cancer so often starts as a polyp that can be seen and removed at colonscopy, which can quite literally save your life!  And if your first colonoscopy is normal and you have no family history of colon cancer, most women don't have to have it again for 10 years.  Once every ten years, you can do something that may end up saving your life, right?  We will be happy to help you get it scheduled when you are ready.    Be sure to check your insurance coverage so you understand how much it will cost.  It may be covered as a preventative service at no cost, but you should check your particular policy.      Breast Tenderness Breast tenderness is a common problem for women of all ages. Breast tenderness may cause mild discomfort to severe pain. The pain usually comes and goes in association with your menstrual cycle, but it can be constant. Breast tenderness has many possible causes, including hormone changes and some medicines. Your health care provider may order tests, such as  a mammogram or an ultrasound, to check for any unusual findings. Having breast tenderness usually does not mean that you have breast cancer. Follow these instructions at home: Sometimes, reassurance that you do not have breast cancer is all that is needed. In general, follow these home care instructions: Managing pain and discomfort  If directed, apply ice to the area: ? Put ice in a plastic bag. ? Place a towel between your skin and the bag. ? Leave the ice on for 20 minutes, 2-3 times a day.  Make sure you are wearing a supportive bra, especially during exercise. You may also want to wear a supportive bra while sleeping if your breasts are very tender. Medicines  Take over-the-counter and prescription medicines only as told by your health care provider. If the cause of your pain is infection, you may be prescribed an antibiotic medicine.  If you were prescribed an antibiotic, take it as told by your health care provider. Do not stop taking the antibiotic even if you start to feel better. General instructions  Your health care provider may recommend that you reduce the amount of fat in your diet. You can do this by: ? Limiting fried foods. ? Cooking foods using methods, such as baking, boiling, grilling, and broiling.  Decrease the amount of caffeine in your diet. You can do this by drinking more water and choosing caffeine-free options.  Keep a log of the days and times when your breasts are most tender.  Ask your health care provider how to do breast exams at home. This will help you notice if you have an unusual growth or lump. Contact a health care provider if:  Any part of your breast is hard, red, and hot to the touch. This may be a sign of infection.  You are not breastfeeding and you have fluid, especially blood or pus, coming out of your nipples.  You have a fever.  You have a new or painful lump in your breast that remains after your menstrual period ends.  Your pain  does not improve or it gets worse.  Your pain is interfering with your daily activities. This information is not intended to replace advice given to you by your health care provider. Make sure you discuss any questions you have with your health care provider. Document Released: 11/11/2008 Document Revised: 08/27/2016 Document Reviewed: 08/27/2016 Elsevier Interactive Patient Education  2018 [employer]  

## 2018-09-27 NOTE — Progress Notes (Signed)
57 y.o. G62P1011 Married  Caucasian Fe here for annual exam. Patient aware she has gained about 20 pounds in the past 3 years and really plans to do this over the next year. Spouse needs to lose 60 pounds so plan to do together.  Sees Dr.Tisovec for aex and labs,cholesterol and glucose management. Working on deciding on what diet she and spouse will start on. Considering Keto diet after 50. Plans to discuss with PCP. Noted some axillary soreness on left, but no change or problems noted with breast. No other health concerns today.  Patient's last menstrual period was 11/27/2012.          Sexually active: Yes.    The current method of family planning is tubal ligation.    Exercising: No.  exercise Smoker:  no  Review of Systems  Constitutional: Negative.   HENT: Negative.   Eyes: Negative.   Respiratory: Negative.   Cardiovascular: Negative.   Gastrointestinal: Negative.   Genitourinary: Negative.   Musculoskeletal: Negative.   Skin: Negative.   Neurological: Negative.   Endo/Heme/Allergies: Negative.   Psychiatric/Behavioral: Negative.     Health Maintenance: Pap:  04-24-15 neg HPV HR neg, 05-25-17 neg History of Abnormal Pap: no MMG:  05-29-18 category b density birads 1:neg Self Breast exams: yes Colonoscopy:  2015 polyps f/u 3yrs BMD:   none TDaP:  2017 Shingles: 2019 Pneumonia: no Hep C and DJS:HFWY done when married-neg Labs: with PCP   reports that she has never smoked. She has never used smokeless tobacco. She reports that she drinks alcohol. She reports that she does not use drugs.  Past Medical History:  Diagnosis Date  . Adenomyosis   . Fibroid 01/13/11   2  . Menorrhagia   . Migraines    with aura    Past Surgical History:  Procedure Laterality Date  . BREAST BIOPSY Right 2006   negative  . BREAST EXCISIONAL BIOPSY Right 2007  . DILATION AND CURETTAGE OF UTERUS  1993   after SAB  . INTRAUTERINE DEVICE (IUD) INSERTION     12/11  . TUBAL LIGATION     BTSP     Current Outpatient Medications  Medication Sig Dispense Refill  . atorvastatin (LIPITOR) 20 MG tablet Take 20 mg by mouth daily.  3  . levocetirizine (XYZAL) 5 MG tablet Take 5 mg by mouth daily.  5  . Multiple Vitamins-Minerals (MULTIVITAMIN PO) Take by mouth daily.     No current facility-administered medications for this visit.     Family History  Problem Relation Age of Onset  . Hypertension Father   . Heart disease Father        CHF  . Diabetes Mother   . Cancer Mother        colon  . Hypertension Mother   . Stroke Mother   . Heart disease Mother        Afib  . Skin cancer Mother   . Diabetes Maternal Grandfather   . Heart attack Paternal Grandmother   . Heart attack Paternal Grandfather     ROS:  Pertinent items are noted in HPI.  Otherwise, a comprehensive ROS was negative.  Exam:   LMP 11/27/2012    Ht Readings from Last 3 Encounters:  05/25/17 5' 7.5" (1.715 m)  05/21/16 5' 7.5" (1.715 m)  01/02/16 5' 7.5" (1.715 m)    General appearance: alert, cooperative and appears stated age Head: Normocephalic, without obvious abnormality, atraumatic Neck: no adenopathy, supple, symmetrical, trachea midline and thyroid  normal to inspection and palpation Lungs: clear to auscultation bilaterally Breasts: normal appearance, no masses or tenderness, No nipple retraction or dimpling, No nipple discharge or bleeding, No axillary or supraclavicular adenopathy, no masses noted or tenderness in axillary area on exam on left. Heart: regular rate and rhythm Abdomen: soft, non-tender; no masses,  no organomegaly Extremities: extremities normal, atraumatic, no cyanosis or edema Skin: Skin color, texture, turgor normal. No rashes or lesions Lymph nodes: Cervical, supraclavicular, and axillary nodes normal. No abnormal inguinal nodes palpated Neurologic: Grossly normal   Pelvic: External genitalia:  Normal female, small white lesion in center below fourchette small pea size,  not raised, but white in color, shown to patient in mirror              Urethra:  normal appearing urethra with no masses, tenderness or lesions              Bartholin's and Skene's: normal                 Vagina: normal appearing vagina with normal color and discharge, no lesions              Cervix: no cervical motion tenderness, no lesions and normal appearance              Pap taken: No. Bimanual Exam:  Uterus:  normal size, contour, position, consistency, mobility, non-tender and mid position              Adnexa: normal adnexa and no mass, fullness, tenderness               Rectovaginal: Confirms               Anus:  normal sphincter tone, no lesions  Chaperone present: yes  A:  Well Woman with normal exam  Menopausal no HRT  Vaginal lesion below introitus area  Glucose and cholesterol management with PCP  P:   Reviewed health and wellness pertinent to exam  Aware of need to advise if vaginal bleeding  Discussed lesion noted and recommend visit with MD, possible biopsy or removal.  Patient agreeable. She will be called to schedule.  Continue follow up with PCP as indicated  Pap smear: no   counseled on breast self exam, mammography screening, STD prevention, HIV risk factors and prevention, feminine hygiene, adequate intake of calcium and vitamin D, diet and exercise  return annually or prn  An After Visit Summary was printed and given to the patient.

## 2018-09-28 ENCOUNTER — Other Ambulatory Visit: Payer: Self-pay | Admitting: *Deleted

## 2018-09-28 DIAGNOSIS — N898 Other specified noninflammatory disorders of vagina: Secondary | ICD-10-CM

## 2018-10-03 ENCOUNTER — Telehealth: Payer: Self-pay | Admitting: Certified Nurse Midwife

## 2018-10-03 NOTE — Telephone Encounter (Signed)
Call to patient. Patient would like to review benefits before scheduling recommended vulvar biopsy. Patient aware of the need of procedure. Advised patient would route to benefits department.   Please call patient to review benefits of recommended vulvar biopsy and schedule patient with Dr. Quincy Simmonds.

## 2018-10-03 NOTE — Telephone Encounter (Signed)
Patient calling to schedule an appointment for a biopsy.

## 2018-10-04 NOTE — Telephone Encounter (Signed)
Call placed to convey benefits. 

## 2018-10-05 NOTE — Telephone Encounter (Signed)
Patient returning Rosa's call for benefits. [phone]

## 2018-10-06 NOTE — Telephone Encounter (Signed)
Returning a call to patient.

## 2018-10-30 ENCOUNTER — Telehealth: Payer: Self-pay | Admitting: Obstetrics and Gynecology

## 2018-10-30 NOTE — Telephone Encounter (Signed)
Patient canceled her upcoming vulvar biopsy via the automated reminder call. I left a message informing the patient the triage nurse would call her to reschedule.

## 2018-10-30 NOTE — Telephone Encounter (Signed)
Call to patient. Patient requests to reschedule vulvar biopsy to December. Vulvar biopsy rescheduled to 11/30/18 at 1530. Patient declined earlier appointments. Patient agreeable to date and time of appointment.   Routing to provider and will close encounter.   Cc Debbi Hollice Espy

## 2018-10-30 NOTE — Telephone Encounter (Signed)
Thank you for the update.  I will forward to Triage and Evalee Mutton.  Cc- Triage, Evalee Mutton

## 2018-11-01 ENCOUNTER — Ambulatory Visit: Payer: 59 | Admitting: Obstetrics and Gynecology

## 2018-11-30 ENCOUNTER — Ambulatory Visit (INDEPENDENT_AMBULATORY_CARE_PROVIDER_SITE_OTHER): Payer: 59 | Admitting: Obstetrics and Gynecology

## 2018-11-30 ENCOUNTER — Other Ambulatory Visit: Payer: Self-pay

## 2018-11-30 ENCOUNTER — Encounter: Payer: Self-pay | Admitting: Obstetrics and Gynecology

## 2018-11-30 VITALS — BP 114/70 | HR 70 | Ht 67.75 in | Wt 222.8 lb

## 2018-11-30 DIAGNOSIS — N898 Other specified noninflammatory disorders of vagina: Secondary | ICD-10-CM

## 2018-11-30 DIAGNOSIS — N9089 Other specified noninflammatory disorders of vulva and perineum: Secondary | ICD-10-CM

## 2018-11-30 NOTE — Progress Notes (Signed)
GYNECOLOGY  VISIT   HPI: 57 y.o.   Married  Caucasian  female   G2P1011 with Patient's last menstrual period was 11/27/2012.here for vulvar biopsy.   GYNECOLOGIC HISTORY: Patient's last menstrual period was 11/27/2012. Contraception:  Tubal Menopausal hormone therapy:  none Last mammogram:   05-29-18 category b density birads 1:neg Last pap smear: 05-25-17 Neg, 04-24-15 Neg:Neg HR HPV.  Normal pap history.         OB History    Gravida  2   Para  1   Term  1   Preterm      AB  1   Living  1     SAB  1   TAB      Ectopic      Multiple      Live Births  1              Patient Active Problem List   Diagnosis Date Noted  . BRONCHITIS, ACUTE 07/02/2009    Past Medical History:  Diagnosis Date  . Adenomyosis   . Fibroid 01/13/11   2  . Menorrhagia   . Migraines    with aura    Past Surgical History:  Procedure Laterality Date  . BREAST BIOPSY Right 2006   negative  . BREAST EXCISIONAL BIOPSY Right 2007  . DILATION AND CURETTAGE OF UTERUS  1993   after SAB  . INTRAUTERINE DEVICE (IUD) INSERTION     12/11  . TUBAL LIGATION     BTSP    Current Outpatient Medications  Medication Sig Dispense Refill  . aspirin EC 81 MG tablet Take 81 mg by mouth daily.    Marland Kitchen atorvastatin (LIPITOR) 20 MG tablet Take 20 mg by mouth daily.  3  . JARDIANCE 25 MG TABS tablet Take 25 mg by mouth daily.  5  . levocetirizine (XYZAL) 5 MG tablet Take 5 mg by mouth daily.  5  . MELATONIN PO Take by mouth.    . Multiple Vitamins-Minerals (MULTIVITAMIN PO) Take by mouth daily.     No current facility-administered medications for this visit.      ALLERGIES: Codeine  Family History  Problem Relation Age of Onset  . Hypertension Father   . Heart disease Father        CHF  . Diabetes Mother   . Cancer Mother        colon  . Hypertension Mother   . Stroke Mother   . Heart disease Mother        Afib  . Skin cancer Mother   . Diabetes Maternal Grandfather   . Heart attack  Paternal Grandmother   . Heart attack Paternal Grandfather     Social History   Socioeconomic History  . Marital status: Married    Spouse name: Not on file  . Number of children: Not on file  . Years of education: Not on file  . Highest education level: Not on file  Occupational History  . Not on file  Social Needs  . Financial resource strain: Not on file  . Food insecurity:    Worry: Not on file    Inability: Not on file  . Transportation needs:    Medical: Not on file    Non-medical: Not on file  Tobacco Use  . Smoking status: Never Smoker  . Smokeless tobacco: Never Used  Substance and Sexual Activity  . Alcohol use: Yes    Alcohol/week: 0.0 - 1.0 standard drinks  . Drug  use: No  . Sexual activity: Yes    Partners: Male    Comment: BTL  Lifestyle  . Physical activity:    Days per week: Not on file    Minutes per session: Not on file  . Stress: Not on file  Relationships  . Social connections:    Talks on phone: Not on file    Gets together: Not on file    Attends religious service: Not on file    Active member of club or organization: Not on file    Attends meetings of clubs or organizations: Not on file    Relationship status: Not on file  . Intimate partner violence:    Fear of current or ex partner: Not on file    Emotionally abused: Not on file    Physically abused: Not on file    Forced sexual activity: Not on file  Other Topics Concern  . Not on file  Social History Narrative  . Not on file    Review of Systems  All other systems reviewed and are negative.   PHYSICAL EXAMINATION:    BP 114/70 (BP Location: Right Arm, Patient Position: Sitting, Cuff Size: Large)   Pulse 70   Ht 5' 7.75" (1.721 m)   Wt 222 lb 12.8 oz (101.1 kg)   LMP 11/27/2012   BMI 34.13 kg/m     General appearance: alert, cooperative and appears stated age Head: Normocephalic, without obvious abnormality, atraumatic Neck: no adenopathy, supple, symmetrical, trachea  midline and thyroid normal to inspection and palpation Lungs: clear to auscultation bilaterally Breasts: normal appearance, no masses or tenderness, No nipple retraction or dimpling, No nipple discharge or bleeding, No axillary or supraclavicular adenopathy Heart: regular rate and rhythm Abdomen: soft, non-tender, no masses,  no organomegaly Extremities: extremities normal, atraumatic, no cyanosis or edema Skin: Skin color, texture, turgor normal. No rashes or lesions Lymph nodes: Cervical, supraclavicular, and axillary nodes normal. No abnormal inguinal nodes palpated Neurologic: Grossly normal  Pelvic: External genitalia:  4 mm raised white slight thick lesion right perineum.               Urethra:  normal appearing urethra with no masses, tenderness or lesions              Bartholins and Skenes: normal                 Vagina: normal appearing vagina with normal color and discharge, no lesions              Cervix: no lesions                  Vulvar biopsy  Consent for procedure.  Sterile prep with betadine.  Local 1% lidocaine [id number], exp. 4/23. 4 mm punch biopsy used for tissue removal.  Tissue sent to pathology.  Single 3/0 suture of Vicryl used for the vulva.  Minimal EBL.  No complications.   Chaperone was present for exam.  ASSESSMENT  Vulvar lesion.  PLAN  FU biopsy.  Instructions and precautions given.    An After Visit Summary was printed and given to the patient.

## 2018-11-30 NOTE — Patient Instructions (Signed)

## 2018-12-06 ENCOUNTER — Encounter: Payer: Self-pay | Admitting: Obstetrics and Gynecology

## 2018-12-15 DIAGNOSIS — L570 Actinic keratosis: Secondary | ICD-10-CM | POA: Diagnosis not present

## 2018-12-15 DIAGNOSIS — D225 Melanocytic nevi of trunk: Secondary | ICD-10-CM | POA: Diagnosis not present

## 2018-12-15 DIAGNOSIS — L821 Other seborrheic keratosis: Secondary | ICD-10-CM | POA: Diagnosis not present

## 2018-12-15 DIAGNOSIS — D2239 Melanocytic nevi of other parts of face: Secondary | ICD-10-CM | POA: Diagnosis not present

## 2018-12-26 DIAGNOSIS — M4722 Other spondylosis with radiculopathy, cervical region: Secondary | ICD-10-CM | POA: Diagnosis not present

## 2018-12-26 DIAGNOSIS — M9901 Segmental and somatic dysfunction of cervical region: Secondary | ICD-10-CM | POA: Diagnosis not present

## 2019-01-01 DIAGNOSIS — M9901 Segmental and somatic dysfunction of cervical region: Secondary | ICD-10-CM | POA: Diagnosis not present

## 2019-01-01 DIAGNOSIS — M4722 Other spondylosis with radiculopathy, cervical region: Secondary | ICD-10-CM | POA: Diagnosis not present

## 2019-01-03 DIAGNOSIS — M4722 Other spondylosis with radiculopathy, cervical region: Secondary | ICD-10-CM | POA: Diagnosis not present

## 2019-01-03 DIAGNOSIS — M9901 Segmental and somatic dysfunction of cervical region: Secondary | ICD-10-CM | POA: Diagnosis not present

## 2019-01-08 DIAGNOSIS — M9901 Segmental and somatic dysfunction of cervical region: Secondary | ICD-10-CM | POA: Diagnosis not present

## 2019-01-08 DIAGNOSIS — M4722 Other spondylosis with radiculopathy, cervical region: Secondary | ICD-10-CM | POA: Diagnosis not present

## 2019-01-10 DIAGNOSIS — M4722 Other spondylosis with radiculopathy, cervical region: Secondary | ICD-10-CM | POA: Diagnosis not present

## 2019-01-10 DIAGNOSIS — M9901 Segmental and somatic dysfunction of cervical region: Secondary | ICD-10-CM | POA: Diagnosis not present

## 2019-01-23 DIAGNOSIS — E8881 Metabolic syndrome: Secondary | ICD-10-CM | POA: Diagnosis not present

## 2019-01-23 DIAGNOSIS — E119 Type 2 diabetes mellitus without complications: Secondary | ICD-10-CM | POA: Diagnosis not present

## 2019-01-23 DIAGNOSIS — E7800 Pure hypercholesterolemia, unspecified: Secondary | ICD-10-CM | POA: Diagnosis not present

## 2019-01-29 DIAGNOSIS — M9901 Segmental and somatic dysfunction of cervical region: Secondary | ICD-10-CM | POA: Diagnosis not present

## 2019-01-29 DIAGNOSIS — M4722 Other spondylosis with radiculopathy, cervical region: Secondary | ICD-10-CM | POA: Diagnosis not present

## 2019-01-31 DIAGNOSIS — M9901 Segmental and somatic dysfunction of cervical region: Secondary | ICD-10-CM | POA: Diagnosis not present

## 2019-01-31 DIAGNOSIS — M4722 Other spondylosis with radiculopathy, cervical region: Secondary | ICD-10-CM | POA: Diagnosis not present

## 2019-02-05 DIAGNOSIS — M4722 Other spondylosis with radiculopathy, cervical region: Secondary | ICD-10-CM | POA: Diagnosis not present

## 2019-02-05 DIAGNOSIS — M9901 Segmental and somatic dysfunction of cervical region: Secondary | ICD-10-CM | POA: Diagnosis not present

## 2019-02-06 DIAGNOSIS — M9901 Segmental and somatic dysfunction of cervical region: Secondary | ICD-10-CM | POA: Diagnosis not present

## 2019-02-06 DIAGNOSIS — M4722 Other spondylosis with radiculopathy, cervical region: Secondary | ICD-10-CM | POA: Diagnosis not present

## 2019-02-13 DIAGNOSIS — M4722 Other spondylosis with radiculopathy, cervical region: Secondary | ICD-10-CM | POA: Diagnosis not present

## 2019-02-13 DIAGNOSIS — M9901 Segmental and somatic dysfunction of cervical region: Secondary | ICD-10-CM | POA: Diagnosis not present

## 2019-02-20 DIAGNOSIS — M9901 Segmental and somatic dysfunction of cervical region: Secondary | ICD-10-CM | POA: Diagnosis not present

## 2019-02-20 DIAGNOSIS — M4722 Other spondylosis with radiculopathy, cervical region: Secondary | ICD-10-CM | POA: Diagnosis not present

## 2019-02-27 DIAGNOSIS — M9901 Segmental and somatic dysfunction of cervical region: Secondary | ICD-10-CM | POA: Diagnosis not present

## 2019-02-27 DIAGNOSIS — M4722 Other spondylosis with radiculopathy, cervical region: Secondary | ICD-10-CM | POA: Diagnosis not present

## 2019-03-06 DIAGNOSIS — M4722 Other spondylosis with radiculopathy, cervical region: Secondary | ICD-10-CM | POA: Diagnosis not present

## 2019-03-06 DIAGNOSIS — M9901 Segmental and somatic dysfunction of cervical region: Secondary | ICD-10-CM | POA: Diagnosis not present

## 2019-05-29 ENCOUNTER — Other Ambulatory Visit: Payer: Self-pay | Admitting: Certified Nurse Midwife

## 2019-05-29 DIAGNOSIS — Z1231 Encounter for screening mammogram for malignant neoplasm of breast: Secondary | ICD-10-CM

## 2019-07-10 ENCOUNTER — Ambulatory Visit: Payer: 59

## 2019-07-12 DIAGNOSIS — Z8249 Family history of ischemic heart disease and other diseases of the circulatory system: Secondary | ICD-10-CM | POA: Insufficient documentation

## 2019-08-07 ENCOUNTER — Other Ambulatory Visit: Payer: Self-pay

## 2019-08-07 ENCOUNTER — Ambulatory Visit (INDEPENDENT_AMBULATORY_CARE_PROVIDER_SITE_OTHER): Payer: 59 | Admitting: Cardiology

## 2019-08-07 ENCOUNTER — Encounter: Payer: Self-pay | Admitting: Cardiology

## 2019-08-07 VITALS — BP 128/68 | HR 66 | Ht 68.0 in | Wt 201.0 lb

## 2019-08-07 DIAGNOSIS — M79602 Pain in left arm: Secondary | ICD-10-CM | POA: Insufficient documentation

## 2019-08-07 DIAGNOSIS — E7800 Pure hypercholesterolemia, unspecified: Secondary | ICD-10-CM

## 2019-08-07 DIAGNOSIS — M79622 Pain in left upper arm: Secondary | ICD-10-CM | POA: Diagnosis not present

## 2019-08-07 DIAGNOSIS — I519 Heart disease, unspecified: Secondary | ICD-10-CM

## 2019-08-07 NOTE — Progress Notes (Signed)
Subjective:  Primary Physician:  Haywood Pao, MD  Patient ID: Kerri Dunn, female    DOB: 1961-11-21, 58 y.o.   MRN: [id number]  Chief Complaint  Patient presents with  . L Arm pain  . Blood Pressure Check    HPI: Kerri Dunn  is a 58 y.o. female. About one month ago, patient had pressure like pain in the left shoulder and arm.  She denies any associated chest pain, tightness or pressure.  Arm pain was not related to exertion.  It usually lasted for about 15 minutes.  Occurred more frequently in the evenings.  Patient checked her blood pressure in both the arms and initially there was a difference of 30 mmHg, blood pressure being higher in the left arm.  Subsequently, the pressure differences was less and recently it has been 10-15 mmHg.  She denies feeling tiredness in anyone arm.  Patient has not done any work with her arms raised in past one month.   No complaints of shortness of breath, orthopnea or PND.  No history of swelling on the legs and no claudication.  She has occasional cramps in the legs at night.  No history of palpitation, dizziness, near-syncope or syncope.  Patient has diabetes mellitus type 2.  No history of hypertension.  She has hypercholesterolemia.  She does not smoke.  No history of thyroid problems.  No history of TIA or CVA.  Past Medical History:  Diagnosis Date  . Adenomyosis   . Fibroid 01/13/11   2  . Lichen sclerosus et atrophicus of the vulva 2019  . Menorrhagia   . Migraines    with aura    Past Surgical History:  Procedure Laterality Date  . BREAST BIOPSY Right 2006   negative  . BREAST EXCISIONAL BIOPSY Right 2007  . DILATION AND CURETTAGE OF UTERUS  1993   after SAB  . INTRAUTERINE DEVICE (IUD) INSERTION     12/11  . TUBAL LIGATION     BTSP    Social History   Socioeconomic History  . Marital status: Married    Spouse name: Not on file  . Number of children: 1  . Years of education: Not on file  . Highest  education level: Not on file  Occupational History  . Not on file  Social Needs  . Financial resource strain: Not on file  . Food insecurity    Worry: Not on file    Inability: Not on file  . Transportation needs    Medical: Not on file    Non-medical: Not on file  Tobacco Use  . Smoking status: Never Smoker  . Smokeless tobacco: Never Used  Substance and Sexual Activity  . Alcohol use: Yes    Alcohol/week: 0.0 - 1.0 standard drinks  . Drug use: No  . Sexual activity: Yes    Partners: Male    Comment: BTL  Lifestyle  . Physical activity    Days per week: Not on file    Minutes per session: Not on file  . Stress: Not on file  Relationships  . Social Herbalist on phone: Not on file    Gets together: Not on file    Attends religious service: Not on file    Active member of club or organization: Not on file    Attends meetings of clubs or organizations: Not on file    Relationship status: Not on file  . Intimate partner violence  Fear of current or ex partner: Not on file    Emotionally abused: Not on file    Physically abused: Not on file    Forced sexual activity: Not on file  Other Topics Concern  . Not on file  Social History Narrative  . Not on file    Current Outpatient Medications on File Prior to Visit  Medication Sig Dispense Refill  . aspirin EC 81 MG tablet Take 81 mg by mouth daily.    Marland Kitchen atorvastatin (LIPITOR) 20 MG tablet Take 20 mg by mouth daily.  3  . JARDIANCE 25 MG TABS tablet Take 25 mg by mouth daily.  5  . levocetirizine (XYZAL) 5 MG tablet Take 5 mg by mouth daily.  5  . MELATONIN PO Take by mouth.    . Multiple Vitamins-Minerals (MULTIVITAMIN PO) Take by mouth daily.     No current facility-administered medications on file prior to visit.     Review of Systems  Constitutional: Negative for fever.  HENT: Negative for nosebleeds.   Eyes: Negative for blurred vision.  Respiratory: Negative for cough and shortness of breath.    Cardiovascular: Negative for chest pain, palpitations, orthopnea, claudication and leg swelling.  Gastrointestinal: Negative for abdominal pain, nausea and vomiting.  Genitourinary: Negative for dysuria.  Musculoskeletal: Negative for myalgias.       Left arm pain.  Skin: Negative for itching and rash.  Neurological: Negative for dizziness, seizures and loss of consciousness.  Psychiatric/Behavioral: The patient is not nervous/anxious.       Objective:  Blood pressure 128/68, pulse 66, height 5\' 8"  (1.727 m), weight 201 lb (91.2 kg), last menstrual period 11/27/2012, SpO2 97 %. Body mass index is 30.56 kg/m. I checked the blood pressure in both arms-left arm-120/68, right arm-110/64.  Physical Exam  Constitutional: She is oriented to person, place, and time. She appears well-developed.  Slightly obese  HENT:  Head: Normocephalic and atraumatic.  Eyes: Conjunctivae are normal.  Neck: No thyromegaly present.  Cardiovascular: Normal rate, regular rhythm and normal heart sounds. Exam reveals no gallop.  No murmur heard. Pulses:      Carotid pulses are 2+ on the right side and 2+ on the left side.      Radial pulses are 2+ on the right side and 2+ on the left side.       Femoral pulses are 2+ on the right side and 2+ on the left side.      Dorsalis pedis pulses are 2+ on the right side and 2+ on the left side.       Posterior tibial pulses are 2+ on the right side and 2+ on the left side.  Both subclavian arterial pulses are normal and there is no bruit audible.  No carotid bruits audible.  Pulmonary/Chest: She has no wheezes. She has no rales.  Abdominal: She exhibits no mass. There is no abdominal tenderness.  Musculoskeletal:        General: No edema.  Lymphadenopathy:    She has no cervical adenopathy.  Neurological: She is alert and oriented to person, place, and time.  Skin: Skin is warm.   CARDIAC STUDIES:   Assessment & Recommendations:   - Left upper arm pain - Plan:  EKG 12-Lead- Sinus  Rhythm  Low voltage in chest leads. Minor nonspecific T wave abnormality.  Plan- PCV CARDIAC STRESS TEST, PCV UPPER ARTERIAL DUPLEX (BILATERAL), PCV UPPER ARTERIAL DUPLEX (BILATERAL).  - Blood pressure difference between 2 arms  - Hypercholesterolemia  Laboratory Exam: No flowsheet data found.   Lipid Panel  No results found for: CHOL, TRIG, HDL, CHOLHDL, VLDL, LDLCALC, LDLDIRECT  Recommendation:  I have discussed my assessment with the patient.  She has history of left arm pain, etiology unclear.  It may be due to shoulder problem. Patient has risk factors for coronary artery disease as mentioned above in HPI. We will do treadmill stress test for assessment for ischemia.   She also has history of blood pressure difference between the 2 arms. The difference has been up to 30 mm Hg. Today, difference is only 10 mm Hg. She has been scheduled for upper extremity arterial duplex study to check for any subclavian stenosis.  Primary prevention was discussed.  Patient was advised to follow ADA, low-cholesterol diet.  She was also advised calorie restriction to lose weight and was encouraged to walk for at least 30-45 minutes daily for 4-5 days a week.  She will return for follow-up after the above tests.  Despina Hick, MD, [facility] 08/07/2019, 10:50 AM [facility]. [place] Pager: [phone] Office: [phone] If no answer Cell [phone]

## 2019-08-22 ENCOUNTER — Other Ambulatory Visit: Payer: Self-pay

## 2019-08-22 ENCOUNTER — Ambulatory Visit (INDEPENDENT_AMBULATORY_CARE_PROVIDER_SITE_OTHER): Payer: 59

## 2019-08-22 DIAGNOSIS — M79622 Pain in left upper arm: Secondary | ICD-10-CM

## 2019-09-03 ENCOUNTER — Other Ambulatory Visit: Payer: Self-pay

## 2019-09-03 ENCOUNTER — Ambulatory Visit
Admission: RE | Admit: 2019-09-03 | Discharge: 2019-09-03 | Disposition: A | Discharge status: Home / Self Care | Payer: 59 | Source: Ambulatory Visit | Attending: Certified Nurse Midwife | Admitting: Certified Nurse Midwife

## 2019-09-03 DIAGNOSIS — Z1231 Encounter for screening mammogram for malignant neoplasm of breast: Secondary | ICD-10-CM

## 2019-09-04 ENCOUNTER — Ambulatory Visit (INDEPENDENT_AMBULATORY_CARE_PROVIDER_SITE_OTHER): Payer: 59

## 2019-09-04 DIAGNOSIS — M79622 Pain in left upper arm: Secondary | ICD-10-CM | POA: Diagnosis not present

## 2019-09-10 ENCOUNTER — Other Ambulatory Visit: Payer: Self-pay | Admitting: Cardiology

## 2019-09-10 DIAGNOSIS — R6889 Other general symptoms and signs: Secondary | ICD-10-CM

## 2019-09-10 DIAGNOSIS — M79622 Pain in left upper arm: Secondary | ICD-10-CM

## 2019-09-11 ENCOUNTER — Ambulatory Visit: Payer: 59 | Admitting: Cardiology

## 2019-09-12 ENCOUNTER — Ambulatory Visit (INDEPENDENT_AMBULATORY_CARE_PROVIDER_SITE_OTHER): Payer: 59

## 2019-09-12 ENCOUNTER — Other Ambulatory Visit: Payer: Self-pay

## 2019-09-12 DIAGNOSIS — R6889 Other general symptoms and signs: Secondary | ICD-10-CM | POA: Diagnosis not present

## 2019-09-12 DIAGNOSIS — M79622 Pain in left upper arm: Secondary | ICD-10-CM | POA: Diagnosis not present

## 2019-09-18 ENCOUNTER — Other Ambulatory Visit: Payer: Self-pay

## 2019-09-18 ENCOUNTER — Encounter: Payer: Self-pay | Admitting: Cardiology

## 2019-09-18 ENCOUNTER — Ambulatory Visit: Payer: 59 | Admitting: Cardiology

## 2019-09-18 VITALS — BP 115/74 | HR 74 | Temp 98.2°F | Ht 68.0 in | Wt 201.0 lb

## 2019-09-18 DIAGNOSIS — E119 Type 2 diabetes mellitus without complications: Secondary | ICD-10-CM | POA: Diagnosis not present

## 2019-09-18 DIAGNOSIS — M79622 Pain in left upper arm: Secondary | ICD-10-CM

## 2019-09-18 DIAGNOSIS — I493 Ventricular premature depolarization: Secondary | ICD-10-CM

## 2019-09-18 DIAGNOSIS — E7800 Pure hypercholesterolemia, unspecified: Secondary | ICD-10-CM | POA: Diagnosis not present

## 2019-09-18 NOTE — Progress Notes (Signed)
Primary Physician:  Haywood Pao, MD   Patient ID: Kerri Dunn, female    DOB: February 01, 1961, 58 y.o.   MRN: [id number]  Subjective:    Chief Complaint  Patient presents with   Hypertension   Arm Pain    left   Follow-up   Results    duplex    HPI: Kerri Dunn  is a 58 y.o. female  with hyperlipidemia and type 2 diabetes recently evaluated by Dr. Woody Seller for left arm pressure and variations in her blood pressure.  She underwent treadmill stress testing and upper extremity arterial duplex and now presents for results.    She has not had further left arm pain. Blood pressure has been stable bilaterally.   No complaints of shortness of breath, orthopnea or PND.  No history of swelling on the legs and no claudication.  She has occasional cramps in the legs at night.  No history of palpitation, dizziness, near-syncope or syncope.  She does not smoke. She does not exercise, and admits to being mostly sedentary.  No history of thyroid problems.  No history of TIA or CVA. Past Medical History:  Diagnosis Date   Adenomyosis    Fibroid [id number]   2   Lichen sclerosus et atrophicus of the vulva 2019   Menorrhagia    Migraines    with aura    Past Surgical History:  Procedure Laterality Date   BREAST BIOPSY Right 2006   negative   BREAST EXCISIONAL BIOPSY Right 2007   DILATION AND CURETTAGE OF UTERUS  1993   after SAB   INTRAUTERINE DEVICE (IUD) INSERTION     12/11   TUBAL LIGATION     BTSP    Social History   Socioeconomic History   Marital status: Married    Spouse name: Not on file   Number of children: 1   Years of education: Not on file   Highest education level: Not on file  Occupational History   Not on file  Social Needs   Financial resource strain: Not on file   Food insecurity    Worry: Not on file    Inability: Not on file   Transportation needs    Medical: Not on file    Non-medical: Not on file  Tobacco Use     Smoking status: Never Smoker   Smokeless tobacco: Never Used  Substance and Sexual Activity   Alcohol use: Yes    Alcohol/week: 0.0 - 1.0 standard drinks    Comment: occ   Drug use: No   Sexual activity: Yes    Partners: Male    Comment: BTL  Lifestyle   Physical activity    Days per week: Not on file    Minutes per session: Not on file   Stress: Not on file  Relationships   Social connections    Talks on phone: Not on file    Gets together: Not on file    Attends religious service: Not on file    Active member of club or organization: Not on file    Attends meetings of clubs or organizations: Not on file    Relationship status: Not on file   Intimate partner violence    Fear of current or ex partner: Not on file    Emotionally abused: Not on file    Physically abused: Not on file    Forced sexual activity: Not on file  Other Topics Concern   Not on file  Social History Narrative   Not on file    Review of Systems  Constitution: Negative for decreased appetite, malaise/fatigue, weight gain and weight loss.  Eyes: Negative for visual disturbance.  Cardiovascular: Negative for chest pain, claudication, dyspnea on exertion, leg swelling, orthopnea, palpitations and syncope.  Respiratory: Negative for hemoptysis and wheezing.   Endocrine: Negative for cold intolerance and heat intolerance.  Hematologic/Lymphatic: Does not bruise/bleed easily.  Skin: Negative for nail changes.  Musculoskeletal: Negative for muscle weakness and myalgias.  Gastrointestinal: Negative for abdominal pain, change in bowel habit, nausea and vomiting.  Neurological: Negative for difficulty with concentration, dizziness, focal weakness and headaches.  Psychiatric/Behavioral: Negative for altered mental status and suicidal ideas.  All other systems reviewed and are negative.     Objective:  Blood pressure 115/74, pulse 74, temperature 98.2 F (36.8 C), height 5\' 8"  (1.727 m), weight  201 lb (91.2 kg), last menstrual period 11/27/2012, SpO2 95 %. Body mass index is 30.56 kg/m.    Physical Exam  Constitutional: She is oriented to person, place, and time. She appears well-developed.  Slightly obese  HENT:  Head: Normocephalic and atraumatic.  Eyes: Conjunctivae are normal.  Neck: No thyromegaly present.  Cardiovascular: Normal rate, regular rhythm and normal heart sounds. Exam reveals no gallop.  No murmur heard. Pulses:      Carotid pulses are 2+ on the right side and 2+ on the left side.      Radial pulses are 2+ on the right side and 2+ on the left side.       Femoral pulses are 2+ on the right side and 2+ on the left side.      Dorsalis pedis pulses are 2+ on the right side and 2+ on the left side.       Posterior tibial pulses are 2+ on the right side and 2+ on the left side.  Both subclavian arterial pulses are normal and there is no bruit audible.  No carotid bruits audible.  Pulmonary/Chest: She has no wheezes. She has no rales.  Abdominal: She exhibits no mass. There is no abdominal tenderness.  Musculoskeletal:        General: No edema.  Lymphadenopathy:    She has no cervical adenopathy.  Neurological: She is alert and oriented to person, place, and time.  Skin: Skin is warm.   Radiology: No results found.  Laboratory examination:    No flowsheet data found. CBC Latest Ref Rng & Units 01/26/2010  WBC 4.0 - 10.5 K/uL 8.2  Hemoglobin 12.0 - 15.0 g/dL 14.6  Hematocrit 36.0 - 46.0 % 42.3  Platelets 150 - 400 K/uL 263   Lipid Panel  No results found for: CHOL, TRIG, HDL, CHOLHDL, VLDL, LDLCALC, LDLDIRECT HEMOGLOBIN A1C No results found for: HGBA1C, MPG TSH No results for input(s): TSH in the last 8760 hours.  PRN Meds:. There are no discontinued medications. Current Meds  Medication Sig   aspirin EC 81 MG tablet Take 81 mg by mouth daily.   atorvastatin (LIPITOR) 20 MG tablet Take 20 mg by mouth daily.   JARDIANCE 25 MG TABS tablet Take  25 mg by mouth daily.   levocetirizine (XYZAL) 5 MG tablet Take 5 mg by mouth daily.   MELATONIN PO Take by mouth.   Multiple Vitamins-Minerals (MULTIVITAMIN PO) Take by mouth daily.    Cardiac Studies:   Treadmill exercise stress test 08/22/2019: Indication arm pain, screening for CAD. Resting EKG the onset normal sinus rhythm.  With peak exercise  there were frequent PVCs, occasional ventricular couplets that persisted throughout exercise. There was no ST-T wave changes of ischemia. Patient exercised for 7:02 minutes and achieved 8.59 METS, achieving 95% of MPHR.  Stress terminated due to fatigue.  Normal blood pressure response.  Impression: Intermediate risk study due to frequent PVCs without ST-T wave changes of ischemia with exercise.  PVCs resolved at recovery.  Assessment:   Frequent PVCs - Plan: PCV MYOCARDIAL PERFUSION WO LEXISCAN  Left upper arm pain  Hypercholesterolemia  Controlled type 2 diabetes mellitus without complication, without long-term current use of insulin ([place])    Recommendations:   I have discussed recently obtained treadmill stress test results, had good exercise capacity, but did have frequent PVCs with exercise.  She has diabetes and hyperlipidemia.  She has not had any recent episodes of left arm pressure for the last few weeks.  In view of her risk factors and frequent PVCs during exercise, I recommended further evaluation with exercise nuclear stress testing.  Also has mildly abnormal EKG.  I have discussed upper extremity duplex results, no significant stenosis was noted. Report not available at this time, but verbally discussed with Dr. Einar Gip.  Blood pressure was taken bilaterally today, no significant variation noted.  I will see her back after her stress test for follow-up and further recommendations.  Miquel Dunn, MSN, APRN, FNP-C [facility]. [place] Office: [phone] Fax: [phone]

## 2019-09-28 ENCOUNTER — Other Ambulatory Visit: Payer: Self-pay

## 2019-10-02 ENCOUNTER — Telehealth: Payer: Self-pay | Admitting: Certified Nurse Midwife

## 2019-10-02 NOTE — Telephone Encounter (Signed)
Patient cancel/reschedule less than 24 hour notice due to "emergency at work".

## 2019-10-03 ENCOUNTER — Ambulatory Visit: Payer: 59 | Admitting: Certified Nurse Midwife

## 2019-10-15 ENCOUNTER — Other Ambulatory Visit: Payer: Self-pay

## 2019-10-15 ENCOUNTER — Ambulatory Visit (INDEPENDENT_AMBULATORY_CARE_PROVIDER_SITE_OTHER): Payer: 59

## 2019-10-15 DIAGNOSIS — I493 Ventricular premature depolarization: Secondary | ICD-10-CM | POA: Diagnosis not present

## 2019-10-23 ENCOUNTER — Other Ambulatory Visit: Payer: Self-pay

## 2019-10-23 ENCOUNTER — Encounter: Payer: Self-pay | Admitting: Cardiology

## 2019-10-23 ENCOUNTER — Ambulatory Visit: Payer: 59

## 2019-10-23 ENCOUNTER — Ambulatory Visit: Payer: 59 | Admitting: Cardiology

## 2019-10-23 VITALS — BP 123/80 | HR 68 | Temp 97.3°F | Ht 68.0 in | Wt 204.0 lb

## 2019-10-23 DIAGNOSIS — R0602 Shortness of breath: Secondary | ICD-10-CM

## 2019-10-23 DIAGNOSIS — E119 Type 2 diabetes mellitus without complications: Secondary | ICD-10-CM

## 2019-10-23 DIAGNOSIS — E7800 Pure hypercholesterolemia, unspecified: Secondary | ICD-10-CM | POA: Diagnosis not present

## 2019-10-23 DIAGNOSIS — I493 Ventricular premature depolarization: Secondary | ICD-10-CM

## 2019-10-23 NOTE — Progress Notes (Signed)
Primary Physician:  Haywood Pao, MD   Patient ID: Kerri Dunn, female    DOB: 07-02-1961, 58 y.o.   MRN: [id number]  Subjective:    Chief Complaint  Patient presents with  . PVCs  . Follow-up    stress test    HPI: Kerri Dunn  is a 59 y.o. female  with hyperlipidemia and type 2 diabetes originally evaluated by [place] for left arm pressure and variations in arm BP. She had negative upper extremity duplex in Sept 2020. She also underwent GXT at that time that showed frequent PVC's. Due to this, recently underwent exercise nuclear stress test and now presents for follow up.  She has not had further left arm pain. She has noticed occasional episodes of chest tightness and heart racing since last seen by [place]. She does mention today shortness of breath with going from standing to squatting position.   No history of swelling on the legs and no claudication.  She has occasional cramps in the legs at night.  No history of palpitation, dizziness, near-syncope or syncope.  She does not smoke. She does not exercise, and admits to being mostly sedentary.  No history of thyroid problems.  No history of TIA or CVA. Past Medical History:  Diagnosis Date  . Adenomyosis   . Fibroid 01/13/11   2  . Lichen sclerosus et atrophicus of the vulva 2019  . Menorrhagia   . Migraines    with aura    Past Surgical History:  Procedure Laterality Date  . BREAST BIOPSY Right 2006   negative  . BREAST EXCISIONAL BIOPSY Right 2007  . DILATION AND CURETTAGE OF UTERUS  1993   after SAB  . INTRAUTERINE DEVICE (IUD) INSERTION     12/11  . TUBAL LIGATION     BTSP    Social History   Socioeconomic History  . Marital status: Married    Spouse name: Not on file  . Number of children: 1  . Years of education: Not on file  . Highest education level: Not on file  Occupational History  . Not on file  Social Needs  . Financial resource strain: Not on file  . Food insecurity    Worry:  Not on file    Inability: Not on file  . Transportation needs    Medical: Not on file    Non-medical: Not on file  Tobacco Use  . Smoking status: Never Smoker  . Smokeless tobacco: Never Used  Substance and Sexual Activity  . Alcohol use: Yes    Alcohol/week: 0.0 - 1.0 standard drinks    Comment: occ  . Drug use: No  . Sexual activity: Yes    Partners: Male    Comment: BTL  Lifestyle  . Physical activity    Days per week: Not on file    Minutes per session: Not on file  . Stress: Not on file  Relationships  . Social Herbalist on phone: Not on file    Gets together: Not on file    Attends religious service: Not on file    Active member of club or organization: Not on file    Attends meetings of clubs or organizations: Not on file    Relationship status: Not on file  . Intimate partner violence    Fear of current or ex partner: Not on file    Emotionally abused: Not on file    Physically abused: Not on file  Forced sexual activity: Not on file  Other Topics Concern  . Not on file  Social History Narrative  . Not on file    Review of Systems  Constitution: Negative for decreased appetite, malaise/fatigue, weight gain and weight loss.  Eyes: Negative for visual disturbance.  Cardiovascular: Positive for palpitations. Negative for chest pain, claudication, dyspnea on exertion, leg swelling, orthopnea and syncope.  Respiratory: Positive for shortness of breath. Negative for hemoptysis and wheezing.   Endocrine: Negative for cold intolerance and heat intolerance.  Hematologic/Lymphatic: Does not bruise/bleed easily.  Skin: Negative for nail changes.  Musculoskeletal: Negative for muscle weakness and myalgias.  Gastrointestinal: Negative for abdominal pain, change in bowel habit, nausea and vomiting.  Neurological: Negative for difficulty with concentration, dizziness, focal weakness and headaches.  Psychiatric/Behavioral: Negative for altered mental status and  suicidal ideas.  All other systems reviewed and are negative.     Objective:  Blood pressure 123/80, pulse 68, temperature (!) 97.3 F (36.3 C), height 5\' 8"  (1.727 m), weight 204 lb (92.5 kg), last menstrual period 11/27/2012, SpO2 95 %. Body mass index is 31.02 kg/m.    Physical Exam  Constitutional: She is oriented to person, place, and time. She appears well-developed.  Slightly obese  HENT:  Head: Normocephalic and atraumatic.  Eyes: Conjunctivae are normal.  Neck: No thyromegaly present.  Cardiovascular: Normal rate, regular rhythm and normal heart sounds.  Occasional extrasystoles are present. Exam reveals no gallop.  No murmur heard. Pulses:      Carotid pulses are 2+ on the right side and 2+ on the left side.      Radial pulses are 2+ on the right side and 2+ on the left side.       Femoral pulses are 2+ on the right side and 2+ on the left side.      Dorsalis pedis pulses are 2+ on the right side and 2+ on the left side.       Posterior tibial pulses are 2+ on the right side and 2+ on the left side.  Both subclavian arterial pulses are normal and there is no bruit audible.  No carotid bruits audible.  Pulmonary/Chest: She has no wheezes. She has no rales.  Abdominal: She exhibits no mass. There is no abdominal tenderness.  Musculoskeletal:        General: No edema.  Lymphadenopathy:    She has no cervical adenopathy.  Neurological: She is alert and oriented to person, place, and time.  Skin: Skin is warm.  Vitals reviewed.  Radiology: No results found.  Laboratory examination:    No flowsheet data found. CBC Latest Ref Rng & Units 01/26/2010  WBC 4.0 - 10.5 K/uL 8.2  Hemoglobin 12.0 - 15.0 g/dL 14.6  Hematocrit 36.0 - 46.0 % 42.3  Platelets 150 - 400 K/uL 263   Lipid Panel  No results found for: CHOL, TRIG, HDL, CHOLHDL, VLDL, LDLCALC, LDLDIRECT HEMOGLOBIN A1C No results found for: HGBA1C, MPG TSH No results for input(s): TSH in the last 8760 hours.   PRN Meds:. There are no discontinued medications. Current Meds  Medication Sig  . aspirin EC 81 MG tablet Take 81 mg by mouth daily.  Marland Kitchen atorvastatin (LIPITOR) 20 MG tablet Take 20 mg by mouth daily.  Marland Kitchen JARDIANCE 25 MG TABS tablet Take 25 mg by mouth daily.  Marland Kitchen levocetirizine (XYZAL) 5 MG tablet Take 5 mg by mouth daily.  Marland Kitchen MELATONIN PO Take by mouth.  . Multiple Vitamins-Minerals (MULTIVITAMIN PO) Take by  mouth daily.    Cardiac Studies:   Exercise Sestamibi stress test 10/15/2019: No previous exam available for comparison. Exercise nuclear stress test was performed using Bruce protocol. Patient reached 7 METS, and 91% of age predicted maximum heart rate. Exercise capacity was low. No chest pain reported. Heart rate and hemodynamic response were normal. Stress EKG demonstrated sinus tachycardia, no ST-T wave abnormalities, and frequent PVC's through rest, exercise, and recovery period.  SPECT stress and rest images demonstrate minimal apical thinning on rest and stress images. Stress LVEF 66%.  Intermediate risk study due to frequent PVC's.   Treadmill exercise stress test 08/22/2019: Indication arm pain, screening for CAD. Resting EKG the onset normal sinus rhythm.  With peak exercise there were frequent PVCs, occasional ventricular couplets that persisted throughout exercise. There was no ST-T wave changes of ischemia. Patient exercised for 7:02 minutes and achieved 8.59 METS, achieving 95% of MPHR.  Stress terminated due to fatigue.  Normal blood pressure response.  Impression: Intermediate risk study due to frequent PVCs without ST-T wave changes of ischemia with exercise.  PVCs resolved at recovery.  Upper extremity arterial duplex 09/12/2019: No significant velocity increase seen bilaterally in the upper extremities.  Right brachial artery flow velocity is minimally elevated but within normal limits and triphasic form.  Normal triphasic waveforms noted in bilateral upper  extremity.  Assessment:   Frequent PVCs - Plan: Holter monitor - 48 hour, PCV ECHOCARDIOGRAM COMPLETE  Shortness of breath  Hypercholesterolemia  Controlled type 2 diabetes mellitus without complication, without long-term current use of insulin ([place])   Recommendations:   I have discussed exercise nuclear stress test results with the patient, no perfusion abnormalities. She was noted to have PVC's frequently at rest and during exercise on her stress test. She has had a few PVC's on exam today that correlated with symptoms of chest tightness. She also mentions episodes of heart racing. I will place her on 48 hour holter monitor for further evaluation and to evaluate PVC burden to decide if she will need beta blocker or CCB therapy at that time. I have discussed the many etiologies of PVC's. If she has not had thyroid evalaution, would consider this.   She is also reporting some intermittent shortness of breath. Will schedule for echocardiogram to exclude any structural abnormalities. She reports that her lipids and diabetes are well controlled. I will see her back after the test for follow up.   Miquel Dunn, MSN, APRN, FNP-C [facility]. [place] Office: [phone] Fax: [phone]

## 2019-10-24 ENCOUNTER — Other Ambulatory Visit: Payer: Self-pay

## 2019-10-24 NOTE — Progress Notes (Signed)
58 y.o. G33P1011 Married  Caucasian Fe here for annual exam.Menopausal no HRT. Denies vaginal bleeding but some dryness.  Has been working on weight loss over the past 6 months and now has lost 32 pounds. Goal weight is 180. Undergoing cardiology evaluation for PVC's. No blockage noted.  Sees PCP for aex and labs and cholesterol/glucose medication, all stable. No other health issues today.  Patient's last menstrual period was 11/27/2012.          Sexually active: Yes.    The current method of family planning is tubal ligation.    Exercising: No.  exercise Smoker:  no  Review of Systems  Constitutional: Negative.   HENT: Negative.   Eyes: Negative.   Respiratory: Negative.   Cardiovascular: Negative.   Gastrointestinal: Negative.   Genitourinary: Negative.   Musculoskeletal: Negative.   Skin: Negative.   Neurological: Negative.   Endo/Heme/Allergies: Negative.   Psychiatric/Behavioral: Negative.     Health Maintenance: Pap: 04-24-15 neg HPV HR neg, 05-25-17 neg History of Abnormal Pap: no MMG:  09-03-2019 category a density birads 1:neg Self Breast exams: yes Colonoscopy:  2020 f/u 10yrs previous polyp BMD:   none TDaP:  2017 Shingles: 2019 Pneumonia: not done Hep C and HIV: both neg per patient Labs: PCP   reports that she has never smoked. She has never used smokeless tobacco. She reports previous alcohol use. She reports that she does not use drugs.  Past Medical History:  Diagnosis Date  . Adenomyosis   . Fibroid 01/13/11   2  . Lichen sclerosus et atrophicus of the vulva 2019  . Menorrhagia   . Migraines    with aura  . PVC (premature ventricular contraction)     Past Surgical History:  Procedure Laterality Date  . BREAST BIOPSY Right 2006   negative  . BREAST EXCISIONAL BIOPSY Right 2007  . DILATION AND CURETTAGE OF UTERUS  1993   after SAB  . INTRAUTERINE DEVICE (IUD) INSERTION     12/11  . TUBAL LIGATION     BTSP    Current Outpatient Medications   Medication Sig Dispense Refill  . aspirin EC 81 MG tablet Take 81 mg by mouth daily.    Marland Kitchen atorvastatin (LIPITOR) 20 MG tablet Take 20 mg by mouth daily.  3  . JARDIANCE 25 MG TABS tablet Take 25 mg by mouth daily.  5  . levocetirizine (XYZAL) 5 MG tablet Take 5 mg by mouth daily.  5  . MELATONIN PO Take by mouth.    . Multiple Vitamins-Minerals (MULTIVITAMIN PO) Take by mouth daily.     No current facility-administered medications for this visit.     Family History  Problem Relation Age of Onset  . Hypertension Father   . Heart disease Father        CHF  . Diabetes Mother   . Cancer Mother        colon  . Hypertension Mother   . Stroke Mother   . Heart disease Mother        Afib  . Skin cancer Mother   . Diabetes Maternal Grandfather   . Heart attack Paternal Grandmother   . Heart attack Paternal Grandfather   . Breast cancer Neg Hx     ROS:  Pertinent items are noted in HPI.  Otherwise, a comprehensive ROS was negative.  Exam:   BP 110/62   Pulse 68   Temp (!) 97.1 F (36.2 C) (Skin)   Resp 16   Ht  5' 7.5" (1.715 m)   Wt 202 lb (91.6 kg)   LMP 11/27/2012   BMI 31.17 kg/m  Height: 5' 7.5" (171.5 cm) Ht Readings from Last 3 Encounters:  10/26/19 5' 7.5" (1.715 m)  10/23/19 5\' 8"  (1.727 m)  09/18/19 5\' 8"  (1.727 m)    General appearance: alert, cooperative and appears stated age Head: Normocephalic, without obvious abnormality, atraumatic Neck: no adenopathy, supple, symmetrical, trachea midline and thyroid normal to inspection and palpation Lungs: clear to auscultation bilaterally Breasts: normal appearance, no masses or tenderness, No nipple retraction or dimpling, No nipple discharge or bleeding, No axillary or supraclavicular adenopathy Heart: regular rate and rhythm Abdomen: soft, non-tender; no masses,  no organomegaly Extremities: extremities normal, atraumatic, no cyanosis or edema Skin: Skin color, texture, turgor normal. No rashes or lesions Lymph  nodes: Cervical, supraclavicular, and axillary nodes normal. No abnormal inguinal nodes palpated Neurologic: Grossly normal   Pelvic: External genitalia:  no lesions or unusual skin changes              Urethra:  normal appearing urethra with no masses, tenderness or lesions              Bartholin's and Skene's: normal                 Vagina: slight atrophic appearing vagina with normal color and discharge, no lesions, scant moisture              Cervix: no cervical motion tenderness and no lesions              Pap taken: No. Bimanual Exam:  Uterus:  normal size, contour, position, consistency, mobility, non-tender and nodular feel posterior, mid position, c/w history of fibroid              Adnexa: normal adnexa and no mass, fullness, tenderness               Rectovaginal: Confirms               Anus:  normal sphincter tone, no lesions  Chaperone present: yes  A:  Well Woman with normal exam  Post menopausal no HRT  Vaginal dryness  Intentional weight loss  History of uterine fibroid, no size change  Evaluation for PVC's on going with cardiology  P:   Reviewed health and wellness pertinent to exam  Aware of need to advise if vaginal bleeding  Discussed would not recommend ERT cream due cardio issues. Discussed OTC moisture, coconut oil or Olive oil. Patient may try coconut oil instructions given.  Congratulated on weight loss and encouraged to continue you her journey.  Continue follow up with Cardiology and PCP.  Pap smear: no   counseled on breast self exam, mammography screening, feminine hygiene, adequate intake of calcium and vitamin D, diet and exercise, Kegel's exercises  return annually or prn  An After Visit Summary was printed and given to the patient.

## 2019-10-25 DIAGNOSIS — I493 Ventricular premature depolarization: Secondary | ICD-10-CM

## 2019-10-26 ENCOUNTER — Encounter: Payer: Self-pay | Admitting: Certified Nurse Midwife

## 2019-10-26 ENCOUNTER — Other Ambulatory Visit: Payer: Self-pay

## 2019-10-26 ENCOUNTER — Ambulatory Visit: Payer: 59 | Admitting: Certified Nurse Midwife

## 2019-10-26 VITALS — BP 110/62 | HR 68 | Temp 97.1°F | Resp 16 | Ht 67.5 in | Wt 202.0 lb

## 2019-10-26 DIAGNOSIS — Z86018 Personal history of other benign neoplasm: Secondary | ICD-10-CM

## 2019-10-26 DIAGNOSIS — Z780 Asymptomatic menopausal state: Secondary | ICD-10-CM

## 2019-10-26 DIAGNOSIS — Z01419 Encounter for gynecological examination (general) (routine) without abnormal findings: Secondary | ICD-10-CM

## 2019-10-26 DIAGNOSIS — N951 Menopausal and female climacteric states: Secondary | ICD-10-CM

## 2019-10-26 NOTE — Patient Instructions (Signed)
EXERCISE AND DIET:  We recommended that you start or continue a regular exercise program for good health. Regular exercise means any activity that makes your heart beat faster and makes you sweat.  We recommend exercising at least 30 minutes per day at least 3 days a week, preferably 4 or 5.  We also recommend a diet low in fat and sugar.  Inactivity, poor dietary choices and obesity can cause diabetes, heart attack, stroke, and kidney damage, among others.   ° °ALCOHOL AND SMOKING:  Women should limit their alcohol intake to no more than 7 drinks/beers/glasses of wine (combined, not each!) per week. Moderation of alcohol intake to this level decreases your risk of breast cancer and liver damage. And of course, no recreational drugs are part of a healthy lifestyle.  And absolutely no smoking or even second hand smoke. Most people know smoking can cause heart and lung diseases, but did you know it also contributes to weakening of your bones? Aging of your skin?  Yellowing of your teeth and nails? ° °CALCIUM AND VITAMIN D:  Adequate intake of calcium and Vitamin D are recommended.  The recommendations for exact amounts of these supplements seem to change often, but generally speaking 600 mg of calcium (either carbonate or citrate) and 800 units of Vitamin D per day seems prudent. Certain women may benefit from higher intake of Vitamin D.  If you are among these women, your doctor will have told you during your visit.   ° °PAP SMEARS:  Pap smears, to check for cervical cancer or precancers,  have traditionally been done yearly, although recent scientific advances have shown that most women can have pap smears less often.  However, every woman still should have a physical exam from her gynecologist every year. It will include a breast check, inspection of the vulva and vagina to check for abnormal growths or skin changes, a visual exam of the cervix, and then an exam to evaluate the size and shape of the uterus and  ovaries.  And after 56 years of age, a rectal exam is indicated to check for rectal cancers. We will also provide age appropriate advice regarding health maintenance, like when you should have certain vaccines, screening for sexually transmitted diseases, bone density testing, colonoscopy, mammograms, etc.  ° °MAMMOGRAMS:  All women over 40 years old should have a yearly mammogram. Many facilities now offer a "3D" mammogram, which may cost around $50 extra out of pocket. If possible,  we recommend you accept the option to have the 3D mammogram performed.  It both reduces the number of women who will be called back for extra views which then turn out to be normal, and it is better than the routine mammogram at detecting truly abnormal areas.   ° °COLONOSCOPY:  Colonoscopy to screen for colon cancer is recommended for all women at age 50.  We know, you hate the idea of the prep.  We agree, BUT, having colon cancer and not knowing it is worse!!  Colon cancer so often starts as a polyp that can be seen and removed at colonscopy, which can quite literally save your life!  And if your first colonoscopy is normal and you have no family history of colon cancer, most women don't have to have it again for 10 years.  Once every ten years, you can do something that may end up saving your life, right?  We will be happy to help you get it scheduled when you are ready.    Be sure to check your insurance coverage so you understand how much it will cost.  It may be covered as a preventative service at no cost, but you should check your particular policy.   ° ° ° °Atrophic Vaginitis °Atrophic vaginitis is a condition in which the tissues that line the vagina become dry and thin. This condition occurs in women who have stopped having their period. It is caused by a drop in a female hormone (estrogen). This hormone helps: °· To keep the vagina moist. °· To make a clear fluid. This clear fluid helps: °? To make the vagina ready for  sex. °? To protect the vagina from infection. °If the lining of the vagina is dry and thin, it may cause irritation, burning, or itchiness. It may also: °· Make sex painful. °· Make an exam of your vagina painful. °· Cause bleeding. °· Make you lose interest in sex. °· Cause a burning feeling when you pee (urinate). °· Cause a brown or yellow fluid to come from your vagina. °Some women do not have symptoms. °Follow these instructions at home: °Medicines °· Take over-the-counter and prescription medicines only as told by your doctor. °· Do not use herbs or other medicines unless your doctor says it is okay. °· Use medicines for for dryness. These include: °? Oils to make the vagina soft. °? Creams. °? Moisturizers. °General instructions °· Do not douche. °· Do not use products that can make your vagina dry. These include: °? Scented sprays. °? Scented tampons. °? Scented soaps. °· Sex can help increase blood flow and soften the tissue in the vagina. If it hurts to have sex: °? Tell your partner. °? Use products to make sex more comfortable. Use these only as told by your doctor. °Contact a doctor if you: °· Have discharge from the vagina that is different than usual. °· Have a bad smell coming from your vagina. °· Have new symptoms. °· Do not get better. °· Get worse. °Summary °· Atrophic vaginitis is a condition in which the lining of the vagina becomes dry and thin. °· This condition affects women who have stopped having their periods. °· Treatment may include using products that help make the vagina soft. °· Call a doctor if do not get better with treatment. °This information is not intended to replace advice given to you by your health care provider. Make sure you discuss any questions you have with your health care provider. °Document Released: 05/17/2008 Document Revised: 12/12/2017 Document Reviewed: 12/12/2017 °Elsevier Patient Education © 2020 [employer] ° °

## 2019-10-31 ENCOUNTER — Ambulatory Visit (INDEPENDENT_AMBULATORY_CARE_PROVIDER_SITE_OTHER): Payer: 59

## 2019-10-31 ENCOUNTER — Other Ambulatory Visit: Payer: Self-pay

## 2019-10-31 DIAGNOSIS — R0602 Shortness of breath: Secondary | ICD-10-CM | POA: Diagnosis not present

## 2019-10-31 DIAGNOSIS — I493 Ventricular premature depolarization: Secondary | ICD-10-CM

## 2019-11-13 NOTE — Progress Notes (Signed)
48-hour Holter monitor 10/23/2019: Normal sinus rhythm with frequent PVCs (20%) in the form of isolated, couplets, and trigeminal pattern.  Symptoms of shortness of breath correlated with PVCs in a trigeminal pattern and sinus tachycardia.  No nsVT or A. fib was noted.

## 2019-11-14 ENCOUNTER — Telehealth: Payer: Self-pay | Admitting: Cardiology

## 2019-11-14 MED ORDER — METOPROLOL SUCCINATE ER 25 MG PO TB24: 25.0000 mg | ORAL_TABLET | Freq: Every day | ORAL | 1 refills | Status: DC

## 2019-11-14 NOTE — Telephone Encounter (Signed)
Discussed echo and holter monitor results in detail over the phone. In view of high PVC burden, I have recommended Metoprolol succinate 25 mg daily. Will schedule for follow up in 6 weeks.

## 2019-12-10 ENCOUNTER — Other Ambulatory Visit: Payer: Self-pay

## 2019-12-10 DIAGNOSIS — I493 Ventricular premature depolarization: Secondary | ICD-10-CM

## 2019-12-10 MED ORDER — METOPROLOL SUCCINATE ER 25 MG PO TB24: 25.0000 mg | ORAL_TABLET | Freq: Every day | ORAL | 1 refills | Status: DC

## 2019-12-19 ENCOUNTER — Encounter: Payer: Self-pay | Admitting: Cardiology

## 2019-12-19 ENCOUNTER — Other Ambulatory Visit: Payer: Self-pay

## 2019-12-19 ENCOUNTER — Ambulatory Visit: Payer: 59 | Admitting: Cardiology

## 2019-12-19 VITALS — BP 111/73 | HR 76 | Temp 97.9°F | Ht 68.0 in | Wt 208.6 lb

## 2019-12-19 DIAGNOSIS — I493 Ventricular premature depolarization: Secondary | ICD-10-CM

## 2019-12-19 DIAGNOSIS — E7800 Pure hypercholesterolemia, unspecified: Secondary | ICD-10-CM | POA: Diagnosis not present

## 2019-12-19 MED ORDER — DILTIAZEM HCL ER COATED BEADS 120 MG PO CP24: 120.0000 mg | ORAL_CAPSULE | Freq: Every day | ORAL | 1 refills | Status: DC

## 2019-12-19 NOTE — Progress Notes (Signed)
Primary Physician:  Haywood Pao, MD   Patient ID: Kerri Dunn, female    DOB: [birth year], 59 y.o.   MRN: [id number]  Subjective:    Chief Complaint  Patient presents with  . PVC'S  . Follow-up    medication    HPI: METRA MOST  is a 59 y.o. female  with hyperlipidemia and type 2 diabetes originally evaluated by [place] for left arm pressure and variations in arm BP. She had negative upper extremity duplex in Sept 2020. She also underwent GXT at that time that showed frequent PVC's. Due to this, recently underwent exercise nuclear stress test that was considered intermediate stress test in view of frequent PVC's. Echo revealed normal LVEF. Due to her frequent PVC's, holter monitor was placed that showed PVC burden of 20%.   During the interim, she has been started on metoprolol succinate 25 mg daily.  She has noticed increased fatigue since being on the medication.  She continues to have occasional episodes of heart racing.  Denies any chest pain or shortness of breath.  She has not had further left arm pain.   No history of swelling on the legs and no claudication.  She has occasional cramps in the legs at night.  No history of palpitation, dizziness, near-syncope or syncope.  She does not smoke. She does not exercise, and admits to being mostly sedentary.  No history of thyroid problems.  No history of TIA or CVA.  Past Medical History:  Diagnosis Date  . Adenomyosis   . Fibroid 01/13/11   2  . Lichen sclerosus et atrophicus of the vulva 2019  . Menorrhagia   . Migraines    with aura  . PVC (premature ventricular contraction)     Past Surgical History:  Procedure Laterality Date  . BREAST BIOPSY Right 2006   negative  . BREAST EXCISIONAL BIOPSY Right 2007  . DILATION AND CURETTAGE OF UTERUS  1993   after SAB  . INTRAUTERINE DEVICE (IUD) INSERTION     12/11  . TUBAL LIGATION     BTSP    Social History   Socioeconomic History  . Marital status:  Married    Spouse name: Not on file  . Number of children: 1  . Years of education: Not on file  . Highest education level: Not on file  Occupational History  . Not on file  Tobacco Use  . Smoking status: Never Smoker  . Smokeless tobacco: Never Used  Substance and Sexual Activity  . Alcohol use: Not Currently  . Drug use: No  . Sexual activity: Yes    Partners: Male    Birth control/protection: Post-menopausal    Comment: BTL  Other Topics Concern  . Not on file  Social History Narrative  . Not on file   Social Determinants of Health   Financial Resource Strain:   . Difficulty of Paying Living Expenses: Not on file  Food Insecurity:   . Worried About Charity fundraiser in the Last Year: Not on file  . Ran Out of Food in the Last Year: Not on file  Transportation Needs:   . Lack of Transportation (Medical): Not on file  . Lack of Transportation (Non-Medical): Not on file  Physical Activity:   . Days of Exercise per Week: Not on file  . Minutes of Exercise per Session: Not on file  Stress:   . Feeling of Stress : Not on file  Social Connections:   .  Frequency of Communication with Friends and Family: Not on file  . Frequency of Social Gatherings with Friends and Family: Not on file  . Attends Religious Services: Not on file  . Active Member of Clubs or Organizations: Not on file  . Attends Archivist Meetings: Not on file  . Marital Status: Not on file  Intimate Partner Violence:   . Fear of Current or Ex-Partner: Not on file  . Emotionally Abused: Not on file  . Physically Abused: Not on file  . Sexually Abused: Not on file    Review of Systems  Constitution: Negative for decreased appetite, malaise/fatigue, weight gain and weight loss.  Eyes: Negative for visual disturbance.  Cardiovascular: Positive for palpitations. Negative for chest pain, claudication, dyspnea on exertion, leg swelling, orthopnea and syncope.  Respiratory: Negative for  hemoptysis, shortness of breath and wheezing.   Endocrine: Negative for cold intolerance and heat intolerance.  Hematologic/Lymphatic: Does not bruise/bleed easily.  Skin: Negative for nail changes.  Musculoskeletal: Negative for muscle weakness and myalgias.  Gastrointestinal: Negative for abdominal pain, change in bowel habit, nausea and vomiting.  Neurological: Negative for difficulty with concentration, dizziness, focal weakness and headaches.  Psychiatric/Behavioral: Negative for altered mental status and suicidal ideas.  All other systems reviewed and are negative.     Objective:  Blood pressure 111/73, pulse 76, temperature 97.9 F (36.6 C), height 5\' 8"  (1.727 m), weight 208 lb 9.6 oz (94.6 kg), last menstrual period 11/27/2012, SpO2 96 %. Body mass index is 31.72 kg/m.    Physical Exam  Constitutional: She is oriented to person, place, and time. She appears well-developed and well-nourished.  Slightly obese  HENT:  Head: Normocephalic and atraumatic.  Eyes: Conjunctivae are normal.  Neck: No thyromegaly present.  Cardiovascular: Normal rate, regular rhythm and normal heart sounds.  Occasional extrasystoles are present. Exam reveals no gallop.  No murmur heard. Pulses:      Carotid pulses are 2+ on the right side and 2+ on the left side.      Radial pulses are 2+ on the right side and 2+ on the left side.       Femoral pulses are 2+ on the right side and 2+ on the left side.      Dorsalis pedis pulses are 2+ on the right side and 2+ on the left side.       Posterior tibial pulses are 2+ on the right side and 2+ on the left side.  Both subclavian arterial pulses are normal and there is no bruit audible.  No carotid bruits audible.  Pulmonary/Chest: She has no wheezes. She has no rales.  Abdominal: She exhibits no mass. There is no abdominal tenderness.  Musculoskeletal:        General: No edema.  Lymphadenopathy:    She has no cervical adenopathy.  Neurological: She is  alert and oriented to person, place, and time.  Skin: Skin is warm.  Vitals reviewed.  Radiology: No results found.  Laboratory examination:    No flowsheet data found. CBC Latest Ref Rng & Units 01/26/2010  WBC 4.0 - 10.5 K/uL 8.2  Hemoglobin 12.0 - 15.0 g/dL 14.6  Hematocrit 36.0 - 46.0 % 42.3  Platelets 150 - 400 K/uL 263   Lipid Panel  No results found for: CHOL, TRIG, HDL, CHOLHDL, VLDL, LDLCALC, LDLDIRECT HEMOGLOBIN A1C No results found for: HGBA1C, MPG TSH No results for input(s): TSH in the last 8760 hours.  PRN Meds:. Medications Discontinued During This Encounter  Medication Reason  . metoprolol succinate (TOPROL XL) 25 MG 24 hr tablet Discontinued by provider   Current Meds  Medication Sig  . aspirin EC 81 MG tablet Take 81 mg by mouth daily.  Marland Kitchen atorvastatin (LIPITOR) 20 MG tablet Take 20 mg by mouth daily.  Marland Kitchen JARDIANCE 25 MG TABS tablet Take 25 mg by mouth daily.  Marland Kitchen levocetirizine (XYZAL) 5 MG tablet Take 5 mg by mouth daily.  Marland Kitchen MELATONIN PO Take by mouth.  . Multiple Vitamins-Minerals (MULTIVITAMIN PO) Take by mouth daily.  . [DISCONTINUED] metoprolol succinate (TOPROL XL) 25 MG 24 hr tablet Take 1 tablet (25 mg total) by mouth daily.    Cardiac Studies:   48-hour Holter monitor 10/23/2019: Normal sinus rhythm with frequent PVCs (20%) in the form of isolated, couplets, and trigeminal pattern. Symptoms of shortness of breath correlated with PVCs in a trigeminal pattern and sinus tachycardia. No nsVT or A. fib was noted.  Echocardiogram 10/31/2019: Left ventricle cavity is normal in size. Mild concentric hypertrophy of the left ventricle. Normal LV systolic function with EF 67%. Normal global wall motion. Frequent PVC's seen. Doppler evidence of grade I (impaired) diastolic dysfunction, normal LAP.  Trileaflet aortic valve. Mild (Grade I) aortic regurgitation. Mild (Grade I) mitral regurgitation. Mild tricuspid regurgitation.  No evidence of pulmonary  hypertension.  Exercise Sestamibi stress test 10/15/2019: No previous exam available for comparison. Exercise nuclear stress test was performed using Bruce protocol. Patient reached 7 METS, and 91% of age predicted maximum heart rate. Exercise capacity was low. No chest pain reported. Heart rate and hemodynamic response were normal. Stress EKG demonstrated sinus tachycardia, no ST-T wave abnormalities, and frequent PVC's through rest, exercise, and recovery period.  SPECT stress and rest images demonstrate minimal apical thinning on rest and stress images. Stress LVEF 66%.  Intermediate risk study due to frequent PVC's.   Treadmill exercise stress test 08/22/2019: Indication arm pain, screening for CAD. Resting EKG the onset normal sinus rhythm.  With peak exercise there were frequent PVCs, occasional ventricular couplets that persisted throughout exercise. There was no ST-T wave changes of ischemia. Patient exercised for 7:02 minutes and achieved 8.59 METS, achieving 95% of MPHR.  Stress terminated due to fatigue.  Normal blood pressure response.  Impression: Intermediate risk study due to frequent PVCs without ST-T wave changes of ischemia with exercise.  PVCs resolved at recovery.  Upper extremity arterial duplex 09/12/2019: No significant velocity increase seen bilaterally in the upper extremities.  Right brachial artery flow velocity is minimally elevated but within normal limits and triphasic form.  Normal triphasic waveforms noted in bilateral upper extremity.  Assessment:   Frequent PVCs  Hypercholesterolemia   Recommendations:   She continues to have frequent PVCs by physical exam occurring essentially every fourth or fifth beat. She is also having fatigue with metoprolol.  Feel that increasing her metoprolol would likely worsen her symptoms. I would recommend treatment of her frequent PVC's to hopefully help prevent PVC induced cardiomyopathy.  I will try diltiazem 120 mg  daily.  May need to further increase this if she tolerates.  She has not had any chest pain or shortness of breath.  No evidence of PVC induced cardiomyopathy. Nuclear stress test without perfusion abnormalities. She is on Lipitor for management of hypercholesterolemia is being managed by her PCP.  We will see her back in 4 weeks for follow-up on PVCs.   Miquel Dunn, MSN, APRN, FNP-C [facility]. [place] Office: [phone] Fax: [phone]

## 2020-01-11 ENCOUNTER — Other Ambulatory Visit: Payer: Self-pay | Admitting: Cardiology

## 2020-01-15 ENCOUNTER — Ambulatory Visit: Payer: 59 | Admitting: Cardiology

## 2020-01-18 ENCOUNTER — Encounter: Payer: Self-pay | Admitting: Cardiology

## 2020-01-18 ENCOUNTER — Ambulatory Visit: Payer: 59 | Admitting: Cardiology

## 2020-01-18 ENCOUNTER — Other Ambulatory Visit: Payer: Self-pay

## 2020-01-18 VITALS — BP 120/70 | HR 67 | Temp 97.0°F | Ht 68.0 in | Wt 209.8 lb

## 2020-01-18 DIAGNOSIS — R0602 Shortness of breath: Secondary | ICD-10-CM

## 2020-01-18 DIAGNOSIS — I493 Ventricular premature depolarization: Secondary | ICD-10-CM | POA: Diagnosis not present

## 2020-01-18 DIAGNOSIS — E7800 Pure hypercholesterolemia, unspecified: Secondary | ICD-10-CM | POA: Diagnosis not present

## 2020-01-18 MED ORDER — DILTIAZEM HCL ER COATED BEADS 180 MG PO CP24: 180.0000 mg | ORAL_CAPSULE | Freq: Every day | ORAL | 1 refills | Status: DC

## 2020-01-18 NOTE — Progress Notes (Signed)
Primary Physician:  Haywood Pao, MD   Patient ID: Kerri Dunn, female    DOB: 04-20-1961, 59 y.o.   MRN: [id number]  Subjective:    Chief Complaint  Patient presents with  . PVC's  . Hyperlipidemia  . Follow-up    4 week    HPI: Kerri Dunn  is a 59 y.o. female  with hyperlipidemia and type 2 diabetes originally evaluated by [place] for left arm pressure and variations in arm BP. She had negative upper extremity duplex in Sept 2020. She also underwent GXT at that time that showed frequent PVC's. Due to this, recently underwent exercise nuclear stress test that was considered intermediate stress test in view of frequent PVC's. Echo revealed normal LVEF. Due to her frequent PVC's, holter monitor was placed that showed PVC burden of 20%.   Due to fatigue, metoprolol was changed to diltiazem. She now presents for follow up. Tolerating medications well. She continues to note some dyspnea on exertion with walking and sensation of heart racing with activity. Trying to be more active. Fatigue has improved.    She does not smoke.   No history of thyroid problems.  No history of TIA or CVA.  Past Medical History:  Diagnosis Date  . Adenomyosis   . Fibroid 01/13/11   2  . Lichen sclerosus et atrophicus of the vulva 2019  . Menorrhagia   . Migraines    with aura  . PVC (premature ventricular contraction)     Past Surgical History:  Procedure Laterality Date  . BREAST BIOPSY Right 2006   negative  . BREAST EXCISIONAL BIOPSY Right 2007  . DILATION AND CURETTAGE OF UTERUS  1993   after SAB  . INTRAUTERINE DEVICE (IUD) INSERTION     12/11  . TUBAL LIGATION     BTSP    Social History   Socioeconomic History  . Marital status: Married    Spouse name: Not on file  . Number of children: 1  . Years of education: Not on file  . Highest education level: Not on file  Occupational History  . Not on file  Tobacco Use  . Smoking status: Never Smoker  . Smokeless  tobacco: Never Used  Substance and Sexual Activity  . Alcohol use: Not Currently  . Drug use: No  . Sexual activity: Yes    Partners: Male    Birth control/protection: Post-menopausal    Comment: BTL  Other Topics Concern  . Not on file  Social History Narrative  . Not on file   Social Determinants of Health   Financial Resource Strain:   . Difficulty of Paying Living Expenses: Not on file  Food Insecurity:   . Worried About Charity fundraiser in the Last Year: Not on file  . Ran Out of Food in the Last Year: Not on file  Transportation Needs:   . Lack of Transportation (Medical): Not on file  . Lack of Transportation (Non-Medical): Not on file  Physical Activity:   . Days of Exercise per Week: Not on file  . Minutes of Exercise per Session: Not on file  Stress:   . Feeling of Stress : Not on file  Social Connections:   . Frequency of Communication with Friends and Family: Not on file  . Frequency of Social Gatherings with Friends and Family: Not on file  . Attends Religious Services: Not on file  . Active Member of Clubs or Organizations: Not on file  .  Attends Archivist Meetings: Not on file  . Marital Status: Not on file  Intimate Partner Violence:   . Fear of Current or Ex-Partner: Not on file  . Emotionally Abused: Not on file  . Physically Abused: Not on file  . Sexually Abused: Not on file    Review of Systems  Constitution: Negative for decreased appetite, malaise/fatigue, weight gain and weight loss.  Eyes: Negative for visual disturbance.  Cardiovascular: Positive for palpitations. Negative for chest pain, claudication, dyspnea on exertion, leg swelling, orthopnea and syncope.  Respiratory: Negative for hemoptysis, shortness of breath and wheezing.   Endocrine: Negative for cold intolerance and heat intolerance.  Hematologic/Lymphatic: Does not bruise/bleed easily.  Skin: Negative for nail changes.  Musculoskeletal: Negative for muscle weakness  and myalgias.  Gastrointestinal: Negative for abdominal pain, change in bowel habit, nausea and vomiting.  Neurological: Negative for difficulty with concentration, dizziness, focal weakness and headaches.  Psychiatric/Behavioral: Negative for altered mental status and suicidal ideas.  All other systems reviewed and are negative.     Objective:  Blood pressure 120/70, pulse 67, temperature (!) 97 F (36.1 C), height 5\' 8"  (1.727 m), weight 209 lb 12.8 oz (95.2 kg), last menstrual period 11/27/2012, SpO2 95 %. Body mass index is 31.9 kg/m.    Physical Exam  Constitutional: She is oriented to person, place, and time. She appears well-developed and well-nourished.  Slightly obese  HENT:  Head: Normocephalic and atraumatic.  Eyes: Conjunctivae are normal.  Neck: No thyromegaly present.  Cardiovascular: Normal rate, regular rhythm and normal heart sounds.  Occasional extrasystoles are present. Exam reveals no gallop.  No murmur heard. Pulses:      Carotid pulses are 2+ on the right side and 2+ on the left side.      Radial pulses are 2+ on the right side and 2+ on the left side.       Femoral pulses are 2+ on the right side and 2+ on the left side.      Dorsalis pedis pulses are 2+ on the right side and 2+ on the left side.       Posterior tibial pulses are 2+ on the right side and 2+ on the left side.  Both subclavian arterial pulses are normal and there is no bruit audible.  No carotid bruits audible.  Pulmonary/Chest: She has no wheezes. She has no rales.  Abdominal: She exhibits no mass. There is no abdominal tenderness.  Musculoskeletal:        General: No edema.  Lymphadenopathy:    She has no cervical adenopathy.  Neurological: She is alert and oriented to person, place, and time.  Skin: Skin is warm.  Vitals reviewed.  Radiology: No results found.  Laboratory examination:    No flowsheet data found. CBC Latest Ref Rng & Units 01/26/2010  WBC 4.0 - 10.5 K/uL 8.2    Hemoglobin 12.0 - 15.0 g/dL 14.6  Hematocrit 36.0 - 46.0 % 42.3  Platelets 150 - 400 K/uL 263   Lipid Panel  No results found for: CHOL, TRIG, HDL, CHOLHDL, VLDL, LDLCALC, LDLDIRECT HEMOGLOBIN A1C No results found for: HGBA1C, MPG TSH No results for input(s): TSH in the last 8760 hours.  PRN Meds:. Medications Discontinued During This Encounter  Medication Reason  . diltiazem (CARDIZEM CD) 120 MG 24 hr capsule Dose change   Current Meds  Medication Sig  . aspirin EC 81 MG tablet Take 81 mg by mouth daily.  Marland Kitchen atorvastatin (LIPITOR) 20 MG tablet  Take 20 mg by mouth daily.  Marland Kitchen JARDIANCE 25 MG TABS tablet Take 25 mg by mouth daily.  Marland Kitchen levocetirizine (XYZAL) 5 MG tablet Take 5 mg by mouth daily.  Marland Kitchen MELATONIN PO Take by mouth.  . Multiple Vitamins-Minerals (MULTIVITAMIN PO) Take by mouth daily.  . [DISCONTINUED] diltiazem (CARDIZEM CD) 120 MG 24 hr capsule TAKE 1 CAPSULE BY MOUTH EVERY DAY    Cardiac Studies:   48-hour Holter monitor 10/23/2019: Normal sinus rhythm with frequent PVCs (20%) in the form of isolated, couplets, and trigeminal pattern. Symptoms of shortness of breath correlated with PVCs in a trigeminal pattern and sinus tachycardia. No nsVT or A. fib was noted.  Echocardiogram 10/31/2019: Left ventricle cavity is normal in size. Mild concentric hypertrophy of the left ventricle. Normal LV systolic function with EF 67%. Normal global wall motion. Frequent PVC's seen. Doppler evidence of grade I (impaired) diastolic dysfunction, normal LAP.  Trileaflet aortic valve. Mild (Grade I) aortic regurgitation. Mild (Grade I) mitral regurgitation. Mild tricuspid regurgitation.  No evidence of pulmonary hypertension.  Exercise Sestamibi stress test 10/15/2019: No previous exam available for comparison. Exercise nuclear stress test was performed using Bruce protocol. Patient reached 7 METS, and 91% of age predicted maximum heart rate. Exercise capacity was low. No chest pain  reported. Heart rate and hemodynamic response were normal. Stress EKG demonstrated sinus tachycardia, no ST-T wave abnormalities, and frequent PVC's through rest, exercise, and recovery period.  SPECT stress and rest images demonstrate minimal apical thinning on rest and stress images. Stress LVEF 66%.  Intermediate risk study due to frequent PVC's.   Treadmill exercise stress test 08/22/2019: Indication arm pain, screening for CAD. Resting EKG the onset normal sinus rhythm.  With peak exercise there were frequent PVCs, occasional ventricular couplets that persisted throughout exercise. There was no ST-T wave changes of ischemia. Patient exercised for 7:02 minutes and achieved 8.59 METS, achieving 95% of MPHR.  Stress terminated due to fatigue.  Normal blood pressure response.  Impression: Intermediate risk study due to frequent PVCs without ST-T wave changes of ischemia with exercise.  PVCs resolved at recovery.  Upper extremity arterial duplex 09/12/2019: No significant velocity increase seen bilaterally in the upper extremities.  Right brachial artery flow velocity is minimally elevated but within normal limits and triphasic form.  Normal triphasic waveforms noted in bilateral upper extremity.  Assessment:   Frequent PVCs - Plan: EKG 12-Lead  Shortness of breath  Hypercholesterolemia  EKG 01/18/2020: Normal sinus rhythm at 78 bpm with frequent PVC's, normal axis, no evidence of ischemia.   Recommendations:   She is tolerating Ditiazem well and has had improvement in fatigue. She continues to have frequent PVC's on EKG today. Blood pressure has been stable. Will further increase diltiazem to 180 mg daily to see if this will help. Will consider placing her back on holter monitor at her next follow up to see if her PVC burden has reduced. No evidence of PVC induced cardiomyopathy. She does continue to have symptoms of occasional palpitations and dyspnea. She is scheduled to have labs  performed at PCP office, will request that TSH be also performed for evaluation.   She is on lipitor for management of hyperlipidemia. Being managed by her PCP. I will see her back in 3 weeks for follow up on PVC's.   Miquel Dunn, MSN, APRN, FNP-C [facility]. [place] Office: [phone] Fax: [phone]

## 2020-02-08 ENCOUNTER — Other Ambulatory Visit: Payer: Self-pay

## 2020-02-08 ENCOUNTER — Encounter: Payer: Self-pay | Admitting: Cardiology

## 2020-02-08 ENCOUNTER — Ambulatory Visit: Payer: 59 | Admitting: Cardiology

## 2020-02-08 VITALS — BP 114/69 | HR 83 | Temp 97.5°F | Ht 68.0 in | Wt 212.6 lb

## 2020-02-08 DIAGNOSIS — I493 Ventricular premature depolarization: Secondary | ICD-10-CM

## 2020-02-08 DIAGNOSIS — R5383 Other fatigue: Secondary | ICD-10-CM

## 2020-02-08 DIAGNOSIS — R0789 Other chest pain: Secondary | ICD-10-CM

## 2020-02-08 NOTE — Progress Notes (Signed)
Primary Physician:  Haywood Pao, MD   Patient ID: Kerri Dunn, female    DOB: 03-07-1961, 59 y.o.   MRN: [id number]  Subjective:    Chief Complaint  Patient presents with  . Frequent PVC's     follow up    HPI: Kerri Dunn  is a 59 y.o. female  with hyperlipidemia and type 2 diabetes originally evaluated by [place] for left arm pressure and variations in arm BP. She had negative upper extremity duplex in Sept 2020. She also underwent GXT at that time that showed frequent PVC's. Due to this, recently underwent exercise nuclear stress test that was considered intermediate stress test in view of frequent PVC's. Echo revealed normal LVEF. Due to her frequent PVC's, holter monitor was placed that showed PVC burden of 20%.   Due to fatigue, metoprolol was changed to diltiazem.At her last visit, this was increased to 180 mg daily due to continued PVC's. She now presents for follow up. She has noticed improvement in her palpitations. She does continue to notice fatigue. She has also noticed intermittent episodes of chest burning/tightness sensation. Not associated with exertion. Last for a few minutes and resolves on its own.   She does not smoke.   No history of thyroid problems.  No history of TIA or CVA.  Past Medical History:  Diagnosis Date  . Adenomyosis   . Fibroid 01/13/11   2  . Lichen sclerosus et atrophicus of the vulva 2019  . Menorrhagia   . Migraines    with aura  . PVC (premature ventricular contraction)     Past Surgical History:  Procedure Laterality Date  . BREAST BIOPSY Right 2006   negative  . BREAST EXCISIONAL BIOPSY Right 2007  . DILATION AND CURETTAGE OF UTERUS  1993   after SAB  . INTRAUTERINE DEVICE (IUD) INSERTION     12/11  . TUBAL LIGATION     BTSP    Social History   Socioeconomic History  . Marital status: Married    Spouse name: Not on file  . Number of children: 1  . Years of education: Not on file  . Highest education  level: Not on file  Occupational History  . Not on file  Tobacco Use  . Smoking status: Never Smoker  . Smokeless tobacco: Never Used  Substance and Sexual Activity  . Alcohol use: Not Currently  . Drug use: No  . Sexual activity: Yes    Partners: Male    Birth control/protection: Post-menopausal    Comment: BTL  Other Topics Concern  . Not on file  Social History Narrative  . Not on file   Social Determinants of Health   Financial Resource Strain:   . Difficulty of Paying Living Expenses: Not on file  Food Insecurity:   . Worried About Charity fundraiser in the Last Year: Not on file  . Ran Out of Food in the Last Year: Not on file  Transportation Needs:   . Lack of Transportation (Medical): Not on file  . Lack of Transportation (Non-Medical): Not on file  Physical Activity:   . Days of Exercise per Week: Not on file  . Minutes of Exercise per Session: Not on file  Stress:   . Feeling of Stress : Not on file  Social Connections:   . Frequency of Communication with Friends and Family: Not on file  . Frequency of Social Gatherings with Friends and Family: Not on file  . Attends  Religious Services: Not on file  . Active Member of Clubs or Organizations: Not on file  . Attends Archivist Meetings: Not on file  . Marital Status: Not on file  Intimate Partner Violence:   . Fear of Current or Ex-Partner: Not on file  . Emotionally Abused: Not on file  . Physically Abused: Not on file  . Sexually Abused: Not on file    Review of Systems  Constitution: Positive for malaise/fatigue.  Cardiovascular: Positive for chest pain. Negative for claudication, dyspnea on exertion, leg swelling, orthopnea, palpitations and syncope.  Neurological: Negative for dizziness, focal weakness and headaches.  All other systems reviewed and are negative.     Objective:  Blood pressure 114/69, pulse 83, temperature (!) 97.5 F (36.4 C), height 5\' 8"  (1.727 m), weight 212 lb 9.6 oz  (96.4 kg), last menstrual period 11/27/2012, SpO2 94 %. Body mass index is 32.33 kg/m.    Physical Exam  Constitutional: She is oriented to person, place, and time. Vital signs are normal. She appears well-developed and well-nourished.  Cardiovascular: Normal rate, regular rhythm, normal heart sounds and intact distal pulses.  Pulmonary/Chest: Effort normal and breath sounds normal. No accessory muscle usage. No respiratory distress.  Neurological: She is alert and oriented to person, place, and time.  Vitals reviewed.  Radiology: No results found.  Laboratory examination:    No flowsheet data found. CBC Latest Ref Rng & Units 01/26/2010  WBC 4.0 - 10.5 K/uL 8.2  Hemoglobin 12.0 - 15.0 g/dL 14.6  Hematocrit 36.0 - 46.0 % 42.3  Platelets 150 - 400 K/uL 263   Lipid Panel  No results found for: CHOL, TRIG, HDL, CHOLHDL, VLDL, LDLCALC, LDLDIRECT HEMOGLOBIN A1C No results found for: HGBA1C, MPG TSH No results for input(s): TSH in the last 8760 hours.  PRN Meds:. There are no discontinued medications. Current Meds  Medication Sig  . aspirin EC 81 MG tablet Take 81 mg by mouth daily.  Marland Kitchen atorvastatin (LIPITOR) 20 MG tablet Take 20 mg by mouth daily.  Marland Kitchen diltiazem (CARDIZEM CD) 180 MG 24 hr capsule Take 1 capsule (180 mg total) by mouth daily.  Marland Kitchen JARDIANCE 25 MG TABS tablet Take 25 mg by mouth daily.  Marland Kitchen levocetirizine (XYZAL) 5 MG tablet Take 5 mg by mouth daily.  Marland Kitchen MELATONIN PO Take by mouth.  . Multiple Vitamins-Minerals (MULTIVITAMIN PO) Take by mouth daily.    Cardiac Studies:   48-hour Holter monitor 10/23/2019: Normal sinus rhythm with frequent PVCs (20%) in the form of isolated, couplets, and trigeminal pattern. Symptoms of shortness of breath correlated with PVCs in a trigeminal pattern and sinus tachycardia. No nsVT or A. fib was noted.  Echocardiogram 10/31/2019: Left ventricle cavity is normal in size. Mild concentric hypertrophy of the left ventricle. Normal LV  systolic function with EF 67%. Normal global wall motion. Frequent PVC's seen. Doppler evidence of grade I (impaired) diastolic dysfunction, normal LAP.  Trileaflet aortic valve. Mild (Grade I) aortic regurgitation. Mild (Grade I) mitral regurgitation. Mild tricuspid regurgitation.  No evidence of pulmonary hypertension.  Exercise Sestamibi stress test 10/15/2019: No previous exam available for comparison. Exercise nuclear stress test was performed using Bruce protocol. Patient reached 7 METS, and 91% of age predicted maximum heart rate. Exercise capacity was low. No chest pain reported. Heart rate and hemodynamic response were normal. Stress EKG demonstrated sinus tachycardia, no ST-T wave abnormalities, and frequent PVC's through rest, exercise, and recovery period.  SPECT stress and rest images demonstrate minimal apical  thinning on rest and stress images. Stress LVEF 66%.  Intermediate risk study due to frequent PVC's.   Treadmill exercise stress test 08/22/2019: Indication arm pain, screening for CAD. Resting EKG the onset normal sinus rhythm.  With peak exercise there were frequent PVCs, occasional ventricular couplets that persisted throughout exercise. There was no ST-T wave changes of ischemia. Patient exercised for 7:02 minutes and achieved 8.59 METS, achieving 95% of MPHR.  Stress terminated due to fatigue.  Normal blood pressure response.  Impression: Intermediate risk study due to frequent PVCs without ST-T wave changes of ischemia with exercise.  PVCs resolved at recovery.  Upper extremity arterial duplex 09/12/2019: No significant velocity increase seen bilaterally in the upper extremities.  Right brachial artery flow velocity is minimally elevated but within normal limits and triphasic form.  Normal triphasic waveforms noted in bilateral upper extremity.  Assessment:   Frequent PVCs - Plan: EKG 12-Lead  Atypical chest pain  Fatigue, unspecified type  EKG 02/08/2020:  Normal sinus rhythm at 81 bpm, normal axis, no evidence of ischemia.   Recommendations:   I have reviewed her EKG performed today, no PVC's noted and did not have any PVC's by auscultation. She has also noted improvement in her palpitations, will continue with diltiazem 180 mg daily. She does continue to report fatigue, not as significant as with Metoprolol, but still bothersome. Unsure for etiology. May need to consider sleep study for further evaluation. Will see if this will improve with continued use of the medication and controlling her PVC's. Blood pressure has been stable.  She is complaining of atypical chest pain, that is suggestive of GERD. Encouraged her to try Omeprazole OTC daily for the next 2 weeks to see if this will improve. If no improvement, may need further evaluation. I will plan to see her back in 6-8 weeks to follow up on PVC, fatigue, and atypical chest pain.   Miquel Dunn, MSN, APRN, FNP-C [facility]. [place] Office: [phone] Fax: [phone]

## 2020-02-09 ENCOUNTER — Other Ambulatory Visit: Payer: Self-pay | Admitting: Cardiology

## 2020-02-17 ENCOUNTER — Ambulatory Visit: Payer: 59 | Attending: Internal Medicine

## 2020-02-17 DIAGNOSIS — Z23 Encounter for immunization: Secondary | ICD-10-CM | POA: Insufficient documentation

## 2020-02-17 NOTE — Progress Notes (Signed)
   Covid-19 Vaccination Clinic  Name:  Kerri Dunn    MRN: [id number] DOB: 1961/11/02  02/17/2020  [name] was observed post Covid-19 immunization for 15 minutes without incident. She was provided with Vaccine Information Sheet and instruction to access the V-Safe system.   [name] was instructed to call 911 with any severe reactions post vaccine: Marland Kitchen Difficulty breathing  . Swelling of face and throat  . A fast heartbeat  . A bad rash all over body  . Dizziness and weakness   Immunizations Administered    Name Date Dose VIS Date Route   Pfizer COVID-19 Vaccine 02/17/2020 10:31 AM 0.3 mL 11/23/2019 Intramuscular   Manufacturer: [place]   Lot: [id number]   [place]: [id number]

## 2020-03-03 ENCOUNTER — Encounter: Payer: Self-pay | Admitting: Certified Nurse Midwife

## 2020-03-18 ENCOUNTER — Ambulatory Visit: Payer: 59 | Attending: Internal Medicine

## 2020-03-18 DIAGNOSIS — Z23 Encounter for immunization: Secondary | ICD-10-CM

## 2020-03-18 NOTE — Progress Notes (Signed)
   Covid-19 Vaccination Clinic  Name:  Kerri Dunn    MRN: [id number] DOB: 20-Jan-1961  03/18/2020  Kerri Dunn was observed post Covid-19 immunization for 15 minutes without incident. She was provided with Vaccine Information Sheet and instruction to access the V-Safe system.   Kerri Dunn was instructed to call 911 with any severe reactions post vaccine: Marland Kitchen Difficulty breathing  . Swelling of face and throat  . A fast heartbeat  . A bad rash all over body  . Dizziness and weakness   Immunizations Administered    Name Date Dose VIS Date Route   Pfizer COVID-19 Vaccine 03/18/2020  2:03 PM 0.3 mL 11/23/2019 Intramuscular   Manufacturer: [place]   Lot: [id number]   [place]: [id number]

## 2020-04-04 ENCOUNTER — Ambulatory Visit: Payer: 59 | Admitting: Cardiology

## 2020-04-10 ENCOUNTER — Ambulatory Visit (INDEPENDENT_AMBULATORY_CARE_PROVIDER_SITE_OTHER): Payer: 59 | Admitting: Cardiology

## 2020-04-10 ENCOUNTER — Other Ambulatory Visit: Payer: Self-pay

## 2020-04-10 ENCOUNTER — Encounter: Payer: Self-pay | Admitting: Cardiology

## 2020-04-10 VITALS — BP 125/77 | HR 83 | Temp 97.9°F | Resp 15 | Ht 68.0 in | Wt 216.0 lb

## 2020-04-10 DIAGNOSIS — E119 Type 2 diabetes mellitus without complications: Secondary | ICD-10-CM

## 2020-04-10 DIAGNOSIS — R5383 Other fatigue: Secondary | ICD-10-CM

## 2020-04-10 DIAGNOSIS — I493 Ventricular premature depolarization: Secondary | ICD-10-CM

## 2020-04-10 DIAGNOSIS — Z6832 Body mass index (BMI) 32.0-32.9, adult: Secondary | ICD-10-CM

## 2020-04-10 DIAGNOSIS — E7800 Pure hypercholesterolemia, unspecified: Secondary | ICD-10-CM

## 2020-04-10 DIAGNOSIS — E6609 Other obesity due to excess calories: Secondary | ICD-10-CM

## 2020-04-10 NOTE — Progress Notes (Signed)
Kerri Dunn Date of Birth: Sep 13, 1961 MRN: [id number] Primary Care Provider:Tisovec, Fransico Him, MD Former Cardiology Providers: Dr. Humberto Seals, APRN, FNP-C Primary Cardiologist: Rex Kras, DO, [facility] (established care 04/10/2020)  Date: 04/10/20 Last Office Visit: 02/08/2020  Chief Complaint  Patient presents with  . Follow-up    8 week  . Fatigue  . PVC    HPI  Kerri Dunn is a 59 y.o.  female who presents to the office with a chief complaint of " follow-up on PVCs." Patient's past medical history and cardiovascular risk factors include: Hyperlipidemia, type 2 diabetes, palpitations secondary to PVC burden, obesity.  At the last office visit patient was doing well and was not having any episodes of palpitation.  She was tolerating diltiazem well without any significant side effects or intolerances, except fatigue.  Plan was to follow-up in 6 to 8 weeks to reevaluate PVC, her underlying fatigue and atypical chest pain.  Since last office visit patient states that she is doing well overall.  She has not had any episodes of chest pain or palpitations.  Is tolerating the medication well without any side effects or intolerances, except feeling tired and fatigued.   ALLERGIES: Allergies  Allergen Reactions  . Codeine Nausea Only    MEDICATION LIST PRIOR TO VISIT: Current Outpatient Medications on File Prior to Visit  Medication Sig Dispense Refill  . aspirin EC 81 MG tablet Take 81 mg by mouth daily.    Marland Kitchen atorvastatin (LIPITOR) 20 MG tablet Take 20 mg by mouth daily.  3  . diltiazem (CARDIZEM CD) 180 MG 24 hr capsule TAKE 1 CAPSULE BY MOUTH EVERY DAY 30 capsule 3  . JARDIANCE 25 MG TABS tablet Take 25 mg by mouth daily.  5  . levocetirizine (XYZAL) 5 MG tablet Take 5 mg by mouth daily.  5  . MELATONIN PO Take by mouth.    . Multiple Vitamins-Minerals (MULTIVITAMIN PO) Take by mouth daily.     No current facility-administered medications on file prior  to visit.    PAST MEDICAL HISTORY: Past Medical History:  Diagnosis Date  . Adenomyosis   . Fibroid 01/13/11   2  . Lichen sclerosus et atrophicus of the vulva 2019  . Menorrhagia   . Migraines    with aura  . PVC (premature ventricular contraction)     PAST SURGICAL HISTORY: Past Surgical History:  Procedure Laterality Date  . BREAST BIOPSY Right 2006   negative  . BREAST EXCISIONAL BIOPSY Right 2007  . DILATION AND CURETTAGE OF UTERUS  1993   after SAB  . INTRAUTERINE DEVICE (IUD) INSERTION     12/11  . TUBAL LIGATION     BTSP    FAMILY HISTORY: The patient's family history includes Cancer in her mother; Diabetes in her maternal grandfather and mother; Heart attack in her paternal grandfather and paternal grandmother; Heart disease in her father and mother; Hypertension in her father and mother; Skin cancer in her mother; Stroke in her mother.   SOCIAL HISTORY:  The patient  reports that she has never smoked. She has never used smokeless tobacco. She reports previous alcohol use. She reports that she does not use drugs.  Review of Systems  Constitution: Positive for malaise/fatigue. Negative for chills and fever.  HENT: Negative for hoarse voice and nosebleeds.   Eyes: Negative for discharge, double vision and pain.  Cardiovascular: Negative for chest pain, claudication, dyspnea on exertion, leg swelling, near-syncope, orthopnea, palpitations, paroxysmal nocturnal dyspnea and  syncope.  Respiratory: Negative for hemoptysis and shortness of breath.   Musculoskeletal: Negative for muscle cramps and myalgias.  Gastrointestinal: Negative for abdominal pain, constipation, diarrhea, hematemesis, hematochezia, melena, nausea and vomiting.  Neurological: Negative for dizziness and light-headedness.    PHYSICAL EXAM: Vitals with BMI 04/10/2020 02/08/2020 02/08/2020  Height 5\' 8"  - 5\' 8"   Weight 216 lbs - 212 lbs 10 oz  BMI [id number] - [id number]  Systolic 0000000 [id number]  Diastolic 77 69  81  Pulse 83 83 90   CONSTITUTIONAL: Well-developed and well-nourished. No acute distress.  SKIN: Skin is warm and dry. No rash noted. No cyanosis. No pallor. No jaundice HEAD: Normocephalic and atraumatic.  EYES: No scleral icterus MOUTH/THROAT: Moist oral membranes.  NECK: No JVD present. No thyromegaly noted. No carotid bruits  LYMPHATIC: No visible cervical adenopathy.  CHEST Normal respiratory effort. No intercostal retractions  LUNGS: Clear to auscultation bilaterally.  No stridor. No wheezes. No rales.  CARDIOVASCULAR: Regular rate and rhythm, positive [id number], extra systolic beats appreciated, no murmurs rubs or gallops appreciated. ABDOMINAL: No apparent ascites.  EXTREMITIES: No peripheral edema  HEMATOLOGIC: No significant bruising NEUROLOGIC: Oriented to person, place, and time. Nonfocal. Normal muscle tone.  PSYCHIATRIC: Normal mood and affect. Normal behavior. Cooperative  CARDIAC DATABASE: Coronary artery bypass grafting: Year: Surgical anatomy:  Valve surgery:  Pacemaker/ICD in situ:  EKG: 02/08/2020: Normal sinus rhythm at 81 bpm, normal axis, no evidence of ischemia.   Echocardiogram: 10/31/2019: LVEF 67%, mild concentric LVH, normal wall motion, grade 1 diastolic dysfunction, mild AR, mild MR, mild TR.  Stress Testing:  Exercise Sestamibi stress test 10/15/2019: Exercise nuclear stress test was performed using Bruce protocol. Patient reached 7 METS, and 91% of age predicted maximum heart rate. Exercise capacity was low. No chest pain reported. Heart rate and hemodynamic response were normal. Stress EKG demonstrated sinus tachycardia, no ST-T wave abnormalities, and frequent PVC's through rest, exercise, and recovery period. SPECT stress and rest images demonstrate minimal apical thinning on rest and stress images. Stress LVEF 66%. Intermediate risk study due to frequent PVC's.   Heart Catheterization: None  Holter:  48-hour Holter monitor 10/23/2019: Normal  sinus rhythm with frequent PVCs (20%) in the form of isolated, couplets, and trigeminal pattern. Symptoms of shortness of breath correlated with PVCs in a trigeminal pattern and sinus tachycardia. No nsVT or A. fib was noted.  LABORATORY DATA: CBC Latest Ref Rng & Units 01/26/2010  WBC 4.0 - 10.5 K/uL 8.2  Hemoglobin 12.0 - 15.0 g/dL 14.6  Hematocrit 36.0 - 46.0 % 42.3  Platelets 150 - 400 K/uL 263    No flowsheet data found.  Lipid Panel  No results found for: CHOL, TRIG, HDL, CHOLHDL, VLDL, LDLCALC, LDLDIRECT, LABVLDL  No results found for: HGBA1C No components found for: NTPROBNP No results found for: TSH  Cardiac Panel (last 3 results) No results for input(s): CKTOTAL, CKMB, TROPONINIHS, RELINDX in the last 72 hours.  FINAL MEDICATION LIST END OF ENCOUNTER: No orders of the defined types were placed in this encounter.   There are no discontinued medications.   Current Outpatient Medications:  .  aspirin EC 81 MG tablet, Take 81 mg by mouth daily., Disp: , Rfl:  .  atorvastatin (LIPITOR) 20 MG tablet, Take 20 mg by mouth daily., Disp: , Rfl: 3 .  diltiazem (CARDIZEM CD) 180 MG 24 hr capsule, TAKE 1 CAPSULE BY MOUTH EVERY DAY, Disp: 30 capsule, Rfl: 3 .  JARDIANCE 25 MG TABS tablet, Take 25  mg by mouth daily., Disp: , Rfl: 5 .  levocetirizine (XYZAL) 5 MG tablet, Take 5 mg by mouth daily., Disp: , Rfl: 5 .  MELATONIN PO, Take by mouth., Disp: , Rfl:  .  Multiple Vitamins-Minerals (MULTIVITAMIN PO), Take by mouth daily., Disp: , Rfl:   IMPRESSION:    ICD-10-CM   1. Frequent PVCs  I49.3 HOLTER MONITOR - 24 HOUR  2. Fatigue, unspecified type  R53.83   3. Hypercholesterolemia  E78.00   4. Controlled type 2 diabetes mellitus without complication, without long-term current use of insulin (HCC)  E11.9   5. Class 1 obesity due to excess calories with serious comorbidity and body mass index (BMI) of 32.0 to 32.9 in adult  E66.09    Z68.32      RECOMMENDATIONS: Kerri Dunn is a 59 y.o. female whose past medical history and cardiovascular risk factors include: Hyperlipidemia, type 2 diabetes, palpitations secondary to PVC burden, obesity.  Premature ventricular contractions:  In the past patient was noted to have a PVC burden of 20%.  She was initially started on metoprolol but it was discontinued secondary to extreme fatigue.  She was then transitioned to diltiazem and has done well with minor improvement in her underlying fatigue.  She has been on diltiazem for some time now.  Recommend 24-hour Holter to evaluate PVC burden.  If the PVC burden has improved may consider decreasing the dose of diltiazem to help decrease her underlying tiredness and fatigue.  She is also encouraged to discuss noncardiac causes of being tired and fatigue with her primary care provider at the next visit.  Fatigue: See above  Hypercholesterolemia: Continue statin therapy.  Follow lipids.  Managed by primary team.  Non-insulin-dependent diabetes mellitus type 2: Currently managed per primary team.  Patient educated on the importance of glycemic control as part of her primary prevention for cardiovascular disease.  Orders Placed This Encounter  Procedures  . HOLTER MONITOR - 24 HOUR   --Continue cardiac medications as reconciled in final medication list. --Return in about 8 weeks (around 06/05/2020) for re-evaluation of symptoms., review test results.. Or sooner if needed. --Continue follow-up with your primary care physician regarding the management of your other chronic comorbid conditions.  Patient's questions and concerns were addressed to her satisfaction. She voices understanding of the instructions provided during this encounter.   During this visit I reviewed and updated: Tobacco history  allergies medication reconciliation  medical history  surgical history  family history  social history.  This note was created using a voice recognition software as a  result there may be grammatical errors inadvertently enclosed that do not reflect the nature of this encounter. Every attempt is made to correct such errors.  Rex Kras, [place], [facility]  Pager: [phone] Office: [phone]

## 2020-04-15 ENCOUNTER — Other Ambulatory Visit: Payer: Self-pay

## 2020-04-15 ENCOUNTER — Ambulatory Visit: Payer: 59

## 2020-04-15 DIAGNOSIS — I493 Ventricular premature depolarization: Secondary | ICD-10-CM

## 2020-06-05 ENCOUNTER — Ambulatory Visit: Payer: 59 | Admitting: Cardiology

## 2020-06-05 ENCOUNTER — Encounter: Payer: Self-pay | Admitting: Cardiology

## 2020-06-05 ENCOUNTER — Other Ambulatory Visit: Payer: Self-pay

## 2020-06-05 VITALS — BP 121/83 | HR 78 | Resp 15 | Ht 68.0 in | Wt 215.0 lb

## 2020-06-05 DIAGNOSIS — E6609 Other obesity due to excess calories: Secondary | ICD-10-CM

## 2020-06-05 DIAGNOSIS — I493 Ventricular premature depolarization: Secondary | ICD-10-CM

## 2020-06-05 DIAGNOSIS — E119 Type 2 diabetes mellitus without complications: Secondary | ICD-10-CM

## 2020-06-05 DIAGNOSIS — E7800 Pure hypercholesterolemia, unspecified: Secondary | ICD-10-CM

## 2020-06-05 NOTE — Progress Notes (Signed)
Kerri Dunn Date of Birth: 30-Aug-1961 MRN: [id number] Primary Care Provider:Tisovec, Fransico Him, MD Former Cardiology Providers: Dr. Humberto Seals, APRN, FNP-C Primary Cardiologist: Rex Kras, DO, [facility] (established care 04/10/2020)  Date: 06/05/20 Last Office Visit: 04/10/2020  Chief Complaint  Patient presents with  . Follow-up    8 week  . Results    HPI  Kerri Dunn is a 59 y.o.  female who presents to the office with a chief complaint of " follow-up on PVCs and review test results." Patient's past medical history and cardiovascular risk factors include: Hyperlipidemia, type 2 diabetes, palpitations secondary to PVC burden, obesity.  At the last office visit the shared decision was to proceed with a 24-hour Holter monitor to reevaluate the PVC burden.  Monitor results reviewed and discussed with her at today's office visit and noted below for further reference.  Since last office visit patient states that she is doing well overall from a cardiovascular standpoint.  She is tolerating her medications well without any significant side effects or intolerances.   ALLERGIES: Allergies  Allergen Reactions  . Codeine Nausea Only    MEDICATION LIST PRIOR TO VISIT: Current Outpatient Medications on File Prior to Visit  Medication Sig Dispense Refill  . aspirin EC 81 MG tablet Take 81 mg by mouth daily.    Marland Kitchen atorvastatin (LIPITOR) 20 MG tablet Take 20 mg by mouth daily.  3  . diltiazem (CARDIZEM CD) 180 MG 24 hr capsule TAKE 1 CAPSULE BY MOUTH EVERY DAY 30 capsule 3  . JARDIANCE 25 MG TABS tablet Take 25 mg by mouth daily.  5  . levocetirizine (XYZAL) 5 MG tablet Take 5 mg by mouth daily.  5  . MELATONIN PO Take by mouth.    . Multiple Vitamins-Minerals (MULTIVITAMIN PO) Take by mouth daily.     No current facility-administered medications on file prior to visit.    PAST MEDICAL HISTORY: Past Medical History:  Diagnosis Date  . Adenomyosis   . Fibroid  01/13/11   2  . Lichen sclerosus et atrophicus of the vulva 2019  . Menorrhagia   . Migraines    with aura  . PVC (premature ventricular contraction)     PAST SURGICAL HISTORY: Past Surgical History:  Procedure Laterality Date  . BREAST BIOPSY Right 2006   negative  . BREAST EXCISIONAL BIOPSY Right 2007  . DILATION AND CURETTAGE OF UTERUS  1993   after SAB  . INTRAUTERINE DEVICE (IUD) INSERTION     12/11  . TUBAL LIGATION     BTSP    FAMILY HISTORY: The patient's family history includes Cancer in her mother; Diabetes in her maternal grandfather and mother; Heart attack in her paternal grandfather and paternal grandmother; Heart disease in her father and mother; Hypertension in her father and mother; Skin cancer in her mother; Stroke in her mother.   SOCIAL HISTORY:  The patient  reports that she has never smoked. She has never used smokeless tobacco. She reports previous alcohol use. She reports that she does not use drugs.  Review of Systems  Constitutional: Negative for chills, fever and malaise/fatigue.  HENT: Negative for hoarse voice and nosebleeds.   Eyes: Negative for discharge, double vision and pain.  Cardiovascular: Negative for chest pain, claudication, dyspnea on exertion, leg swelling, near-syncope, orthopnea, palpitations, paroxysmal nocturnal dyspnea and syncope.  Respiratory: Negative for hemoptysis and shortness of breath.   Musculoskeletal: Negative for muscle cramps and myalgias.  Gastrointestinal: Negative for abdominal pain,  constipation, diarrhea, hematemesis, hematochezia, melena, nausea and vomiting.  Neurological: Negative for dizziness and light-headedness.    PHYSICAL EXAM: Vitals with BMI 06/05/2020 04/10/2020 02/08/2020  Height 5\' 8"  5\' 8"  -  Weight 215 lbs 216 lbs -  BMI 62.8 31.51 -  Systolic 761 607 371  Diastolic 83 77 69  Pulse 78 83 83   CONSTITUTIONAL: Well-developed and well-nourished. No acute distress.  SKIN: Skin is warm and dry. No  rash noted. No cyanosis. No pallor. No jaundice HEAD: Normocephalic and atraumatic.  EYES: No scleral icterus MOUTH/THROAT: Moist oral membranes.  NECK: No JVD present. No thyromegaly noted. No carotid bruits  LYMPHATIC: No visible cervical adenopathy.  CHEST Normal respiratory effort. No intercostal retractions  LUNGS: Clear to auscultation bilaterally.  No stridor. No wheezes. No rales.  CARDIOVASCULAR: Regular rate and rhythm, positive G6-Y6, extra systolic beats appreciated, no murmurs rubs or gallops appreciated. ABDOMINAL: No apparent ascites.  EXTREMITIES: No peripheral edema  HEMATOLOGIC: No significant bruising NEUROLOGIC: Oriented to person, place, and time. Nonfocal. Normal muscle tone.  PSYCHIATRIC: Normal mood and affect. Normal behavior. Cooperative  CARDIAC DATABASE: EKG: 02/08/2020: Normal sinus rhythm at 81 bpm, normal axis, no evidence of ischemia.   Echocardiogram: 10/31/2019: LVEF 67%, mild concentric LVH, normal wall motion, grade 1 diastolic dysfunction, mild AR, mild MR, mild TR.  Stress Testing:  Exercise Sestamibi stress test 10/15/2019: Exercise nuclear stress test was performed using Bruce protocol. Patient reached 7 METS, and 91% of age predicted maximum heart rate. Exercise capacity was low. No chest pain reported. Heart rate and hemodynamic response were normal. Stress EKG demonstrated sinus tachycardia, no ST-T wave abnormalities, and frequent PVC's through rest, exercise, and recovery period. SPECT stress and rest images demonstrate minimal apical thinning on rest and stress images. Stress LVEF 66%. Intermediate risk study due to frequent PVC's.   Heart Catheterization: None  Holter:  48-hour Holter monitor 10/23/2019: Normal sinus rhythm with frequent PVCs (20%) in the form of isolated, couplets, and trigeminal pattern. Symptoms of shortness of breath correlated with PVCs in a trigeminal pattern and sinus tachycardia. No nsVT or A. fib was noted.  24  hour Holter monitor:  Dominant rhythm normal sinus rhythm, followed by sinus tachycardia (6.7% burden).  Heart rate 57-127 bpm. Average heart rate 78 bpm.  No atrial fibrillation/atrial flutter/supraventricular tachycardia/ventricular tachycardia/high grade AV block, sinus pause greater than or equal to 3 seconds in duration.  558 ventricular ectopic beats accounting for a total ventricular ectopic burden of 0.5%.  Total supraventricular ectopic burden <0.01%.  Number of patient triggered events: 0.  LABORATORY DATA: CBC Latest Ref Rng & Units 01/26/2010  WBC 4.0 - 10.5 K/uL 8.2  Hemoglobin 12.0 - 15.0 g/dL 14.6  Hematocrit 36 - 46 % 42.3  Platelets 150 - 400 K/uL 263    No flowsheet data found.  Lipid Panel  No results found for: CHOL, TRIG, HDL, CHOLHDL, VLDL, LDLCALC, LDLDIRECT, LABVLDL  No results found for: HGBA1C No components found for: NTPROBNP No results found for: TSH  Cardiac Panel (last 3 results) No results for input(s): CKTOTAL, CKMB, TROPONINIHS, RELINDX in the last 72 hours.  FINAL MEDICATION LIST END OF ENCOUNTER: No orders of the defined types were placed in this encounter.   There are no discontinued medications.   Current Outpatient Medications:  .  aspirin EC 81 MG tablet, Take 81 mg by mouth daily., Disp: , Rfl:  .  atorvastatin (LIPITOR) 20 MG tablet, Take 20 mg by mouth daily., Disp: ,  Rfl: 3 .  diltiazem (CARDIZEM CD) 180 MG 24 hr capsule, TAKE 1 CAPSULE BY MOUTH EVERY DAY, Disp: 30 capsule, Rfl: 3 .  JARDIANCE 25 MG TABS tablet, Take 25 mg by mouth daily., Disp: , Rfl: 5 .  levocetirizine (XYZAL) 5 MG tablet, Take 5 mg by mouth daily., Disp: , Rfl: 5 .  MELATONIN PO, Take by mouth., Disp: , Rfl:  .  Multiple Vitamins-Minerals (MULTIVITAMIN PO), Take by mouth daily., Disp: , Rfl:   IMPRESSION:    ICD-10-CM   1. PVC (premature ventricular contraction)  I49.3   2. Hypercholesterolemia  E78.00   3. Controlled type 2 diabetes mellitus without  complication, without long-term current use of insulin (HCC)  E11.9   4. Class 1 obesity due to excess calories with serious comorbidity and body mass index (BMI) of 32.0 to 32.9 in adult  E66.09    Z68.32      RECOMMENDATIONS: Kerri Dunn is a 59 y.o. female whose past medical history and cardiovascular risk factors include: Hyperlipidemia, type 2 diabetes, palpitations secondary to PVC burden, obesity.  Premature ventricular contractions:  In the past patient was noted to have a PVC burden of 20%.  She was initially started on metoprolol but it was discontinued secondary to extreme fatigue.    Was later transitioned to diltiazem which was better tolerated.   Since last office visit patient underwent another 24-hour Holter monitor to evaluate PVC burden after being on CCB.  Results were reviewed with her in great detail and total ventricular ectopic burden has improved significantly compared to prior.   Symptomatically patient is doing well without chest pain, shortness of breath or fatigue.  Hypercholesterolemia: Continue statin therapy.  Follow lipids.  Managed by primary team.  Non-insulin-dependent diabetes mellitus type 2: Currently managed per primary team.  Patient educated on the importance of glycemic control as part of her primary prevention for cardiovascular disease.  No orders of the defined types were placed in this encounter.  --Continue cardiac medications as reconciled in final medication list. --Return in about 6 months (around 12/05/2020) for re-evaluation of symptoms & PVCs. Or sooner if needed. --Continue follow-up with your primary care physician regarding the management of your other chronic comorbid conditions.  Patient's questions and concerns were addressed to her satisfaction. She voices understanding of the instructions provided during this encounter.   This note was created using a voice recognition software as a result there may be grammatical  errors inadvertently enclosed that do not reflect the nature of this encounter. Every attempt is made to correct such errors.  Rex Kras, [place], [facility]  Pager: [phone] Office: [phone]

## 2020-06-07 ENCOUNTER — Other Ambulatory Visit: Payer: Self-pay | Admitting: Cardiology

## 2020-08-05 ENCOUNTER — Other Ambulatory Visit: Payer: Self-pay | Admitting: Obstetrics and Gynecology

## 2020-08-05 DIAGNOSIS — Z1231 Encounter for screening mammogram for malignant neoplasm of breast: Secondary | ICD-10-CM

## 2020-09-17 ENCOUNTER — Ambulatory Visit
Admission: RE | Admit: 2020-09-17 | Discharge: 2020-09-17 | Disposition: A | Discharge status: Home / Self Care | Payer: 59 | Source: Ambulatory Visit | Attending: Obstetrics and Gynecology | Admitting: Obstetrics and Gynecology

## 2020-09-17 ENCOUNTER — Other Ambulatory Visit: Payer: Self-pay

## 2020-09-17 DIAGNOSIS — Z1231 Encounter for screening mammogram for malignant neoplasm of breast: Secondary | ICD-10-CM

## 2020-10-11 ENCOUNTER — Ambulatory Visit: Payer: 59 | Attending: Internal Medicine

## 2020-10-11 DIAGNOSIS — Z23 Encounter for immunization: Secondary | ICD-10-CM

## 2020-10-11 NOTE — Progress Notes (Signed)
   Covid-19 Vaccination Clinic  Name:  JONAH NESTLE    MRN: [id number] DOB: [birth year]  10/11/2020  Ms. Delbridge was observed post Covid-19 immunization for 15 minutes without incident. She was provided with Vaccine Information Sheet and instruction to access the V-Safe system.   Ms. Messing was instructed to call [phone] with any severe reactions post vaccine: Marland Kitchen Difficulty breathing  . Swelling of face and throat  . A fast heartbeat  . A bad rash all over body  . Dizziness and weakness

## 2020-10-28 ENCOUNTER — Ambulatory Visit: Payer: 59 | Admitting: Certified Nurse Midwife

## 2020-10-28 NOTE — Progress Notes (Signed)
59 y.o. G1P1011 Married White or Caucasian Not Hispanic or Latino female here for annual exam.  No vaginal bleeding. No dyspareunia.  Some urge incontinence, occurs ~1 x a month.     Patient's last menstrual period was 11/27/2012.          Sexually active: Yes.    The current method of family planning is tubal ligation.    Exercising: No.  The patient does not participate in regular exercise at present. Smoker:  no  Health Maintenance: Pap:  05-25-17 neg,  04-24-15 neg HPV HR neg History of abnormal Pap:  no MMG:  09/18/20 density A birads 1 neg  BMD:   None  Colonoscopy: 2020 f/u 5 years polyps  TDaP:  2017  Gardasil: NA    reports that she has never smoked. She has never used smokeless tobacco. She reports previous alcohol use. She reports that she does not use drugs. Rare ETOH. Works as an Scientist, research (physical sciences), working from home. Plans to retire at 31. Son is 44, local. Not married.   Past Medical History:  Diagnosis Date  . Adenomyosis   . Elevated cholesterol   . History of diabetes mellitus   . Menorrhagia   . Migraines    with aura  . PVC (premature ventricular contraction)     Past Surgical History:  Procedure Laterality Date  . BREAST BIOPSY Right 2006   negative  . BREAST EXCISIONAL BIOPSY Right 2007  . DILATION AND CURETTAGE OF UTERUS  1993   after SAB  . INTRAUTERINE DEVICE (IUD) INSERTION     12/11  . TUBAL LIGATION     BTSP    Current Outpatient Medications  Medication Sig Dispense Refill  . aspirin EC 81 MG tablet Take 81 mg by mouth daily.    Marland Kitchen atorvastatin (LIPITOR) 20 MG tablet Take 20 mg by mouth daily.  3  . diltiazem (CARDIZEM CD) 180 MG 24 hr capsule TAKE 1 CAPSULE BY MOUTH EVERY DAY 90 capsule 1  . JARDIANCE 25 MG TABS tablet Take 25 mg by mouth daily.  5  . levocetirizine (XYZAL) 5 MG tablet Take 5 mg by mouth daily.  5  . MELATONIN PO Take by mouth.    . Multiple Vitamins-Minerals (MULTIVITAMIN PO) Take by mouth daily.     No current  facility-administered medications for this visit.    Family History  Problem Relation Age of Onset  . Hypertension Father   . Heart disease Father        CHF  . Diabetes Mother   . Cancer Mother        colon  . Hypertension Mother   . Stroke Mother   . Heart disease Mother        Afib  . Skin cancer Mother   . Diabetes Maternal Grandfather   . Heart attack Paternal Grandmother   . Heart attack Paternal Grandfather   . Breast cancer Neg Hx     Review of Systems  All other systems reviewed and are negative.   Exam:   BP 110/62   Pulse 92   Ht 5\' 7"  (1.702 m)   Wt 220 lb (99.8 kg)   LMP 11/27/2012   SpO2 95%   BMI 34.46 kg/m   Weight change: @WEIGHTCHANGE @ Height:   Height: 5\' 7"  (170.2 cm)  Ht Readings from Last 3 Encounters:  10/29/20 5\' 7"  (1.702 m)  06/05/20 5\' 8"  (1.727 m)  04/10/20 5\' 8"  (1.727 m)    General appearance:  alert, cooperative and appears stated age Head: Normocephalic, without obvious abnormality, atraumatic Neck: no adenopathy, supple, symmetrical, trachea midline and thyroid normal to inspection and palpation Lungs: clear to auscultation bilaterally Cardiovascular: regular rate and rhythm Breasts: normal appearance, no masses or tenderness Abdomen: soft, non-tender; non distended,  no masses,  no organomegaly Extremities: extremities normal, atraumatic, no cyanosis or edema Skin: Skin color, texture, turgor normal. No rashes or lesions Lymph nodes: Cervical, supraclavicular, and axillary nodes normal. No abnormal inguinal nodes palpated Neurologic: Grossly normal   Pelvic: External genitalia:  no lesions              Urethra:  normal appearing urethra with no masses, tenderness or lesions              Bartholins and Skenes: normal                 Vagina: normal appearing vagina with normal color and discharge, no lesions              Cervix: no lesions               Bimanual Exam:  Uterus:  anteverted, mobile, not appreciably enlarged,  not tender              Adnexa: no mass, fullness, tenderness               Rectovaginal: Confirms               Anus:  normal sphincter tone, no lesions  Terence Lux chaperoned for the exam.  A:  Well Woman with normal exam  H/O DM, elevated lipids. Last HgbA1C was 6.7%  P:   Pap with hpv  Mammogram just done  Colonoscopy UTD  Discussed breast self exam  Discussed calcium and vit D intake  Labs with primary

## 2020-10-29 ENCOUNTER — Encounter: Payer: Self-pay | Admitting: Obstetrics and Gynecology

## 2020-10-29 ENCOUNTER — Other Ambulatory Visit: Payer: Self-pay

## 2020-10-29 ENCOUNTER — Ambulatory Visit: Payer: 59 | Admitting: Obstetrics and Gynecology

## 2020-10-29 ENCOUNTER — Other Ambulatory Visit (HOSPITAL_COMMUNITY)
Admission: RE | Admit: 2020-10-29 | Discharge: 2020-10-29 | Disposition: A | Discharge status: Home / Self Care | Payer: 59 | Source: Ambulatory Visit | Attending: Obstetrics and Gynecology | Admitting: Obstetrics and Gynecology

## 2020-10-29 VITALS — BP 110/62 | HR 92 | Ht 67.0 in | Wt 220.0 lb

## 2020-10-29 DIAGNOSIS — Z124 Encounter for screening for malignant neoplasm of cervix: Secondary | ICD-10-CM

## 2020-10-29 DIAGNOSIS — E7800 Pure hypercholesterolemia, unspecified: Secondary | ICD-10-CM | POA: Diagnosis not present

## 2020-10-29 DIAGNOSIS — Z01419 Encounter for gynecological examination (general) (routine) without abnormal findings: Secondary | ICD-10-CM

## 2020-10-29 DIAGNOSIS — Z8639 Personal history of other endocrine, nutritional and metabolic disease: Secondary | ICD-10-CM | POA: Diagnosis not present

## 2020-10-29 NOTE — Patient Instructions (Signed)
EXERCISE   We recommended that you start or continue a regular exercise program for good health. Physical activity is anything that gets your body moving, some is better than none. The CDC recommends 150 minutes per week of Moderate-Intensity Aerobic Activity and 2 or more days of Muscle Strengthening Activity.  Benefits of exercise are limitless: helps weight loss/weight maintenance, improves mood and energy, helps with depression and anxiety, improves sleep, tones and strengthens muscles, improves balance, improves bone density, protects from chronic conditions such as heart disease, high blood pressure and diabetes and so much more. To learn more visit: https://www.cdc.gov/physicalactivity/index.html  DIET: Good nutrition starts with a healthy diet of fruits, vegetables, whole grains, and lean protein sources. Drink plenty of water for hydration. Minimize empty calories, sodium, sweets. For more information about dietary recommendations visit: https://health.gov/our-work/nutrition-physical-activity/dietary-guidelines and https://www.myplate.gov/  ALCOHOL:  Women should limit their alcohol intake to no more than 7 drinks/beers/glasses of wine (combined, not each!) per week. Moderation of alcohol intake to this level decreases your risk of breast cancer and liver damage.  If you are concerned that you may have a problem, or your friends have told you they are concerned about your drinking, there are many resources to help. A well-known program that is free, effective, and available to all people all over the nation is Alcoholics Anonymous.  Check out this site to learn more: https://www.aa.org/   CALCIUM AND VITAMIN D:  Adequate intake of calcium and Vitamin D are recommended for bone health.  The recommendations for exact amounts of these supplements seem to change often, but generally speaking 1000-1500 mg of calcium (between diet and supplement) and 800 units of Vitamin D per day seems prudent.      PAP SMEARS:  Pap smears, to check for cervical cancer or precancers,  have traditionally been done yearly, although recent scientific advances have shown that most women can have pap smears less often.  However, every woman still should have a physical exam from her gynecologist every year. It will include a breast check, inspection of the vulva and vagina to check for abnormal growths or skin changes, a visual exam of the cervix, and then an exam to evaluate the size and shape of the uterus and ovaries.  And after 57 years of age, a rectal exam is indicated to check for rectal cancers. We will also provide age appropriate advice regarding health maintenance, like when you should have certain vaccines, screening for sexually transmitted diseases, bone density testing, colonoscopy, mammograms, etc.   MAMMOGRAMS:  All women over 40 years old should have a routine mammogram.   COLON CANCER SCREENING: Now recommend starting at age 45. At this time colonoscopy is not covered for routine screening until 50. There are take home tests that can be done between 45-49.   COLONOSCOPY:  Colonoscopy to screen for colon cancer is recommended for all women at age 50.  We know, you hate the idea of the prep.  We agree, BUT, having colon cancer and not knowing it is worse!!  Colon cancer so often starts as a polyp that can be seen and removed at colonscopy, which can quite literally save your life!  And if your first colonoscopy is normal and you have no family history of colon cancer, most women don't have to have it again for 10 years.  Once every ten years, you can do something that may end up saving your life, right?  We will be happy to help you get it scheduled when   you are ready.  Be sure to check your insurance coverage so you understand how much it will cost.  It may be covered as a preventative service at no cost, but you should check your particular policy.      Breast Self-Awareness Breast self-awareness  means being familiar with how your breasts look and feel. It involves checking your breasts regularly and reporting any changes to your health care provider. Practicing breast self-awareness is important. A change in your breasts can be a sign of a serious medical problem. Being familiar with how your breasts look and feel allows you to find any problems early, when treatment is more likely to be successful. All women should practice breast self-awareness, including women who have had breast implants. How to do a breast self-exam One way to learn what is normal for your breasts and whether your breasts are changing is to do a breast self-exam. To do a breast self-exam: Look for Changes  1. Remove all the clothing above your waist. 2. Stand in front of a mirror in a room with good lighting. 3. Put your hands on your hips. 4. Push your hands firmly downward. 5. Compare your breasts in the mirror. Look for differences between them (asymmetry), such as: ? Differences in shape. ? Differences in size. ? Puckers, dips, and bumps in one breast and not the other. 6. Look at each breast for changes in your skin, such as: ? Redness. ? Scaly areas. 7. Look for changes in your nipples, such as: ? Discharge. ? Bleeding. ? Dimpling. ? Redness. ? A change in position. Feel for Changes Carefully feel your breasts for lumps and changes. It is best to do this while lying on your back on the floor and again while sitting or standing in the shower or tub with soapy water on your skin. Feel each breast in the following way:  Place the arm on the side of the breast you are examining above your head.  Feel your breast with the other hand.  Start in the nipple area and make  inch (2 cm) overlapping circles to feel your breast. Use the pads of your three middle fingers to do this. Apply light pressure, then medium pressure, then firm pressure. The light pressure will allow you to feel the tissue closest to the  skin. The medium pressure will allow you to feel the tissue that is a little deeper. The firm pressure will allow you to feel the tissue close to the ribs.  Continue the overlapping circles, moving downward over the breast until you feel your ribs below your breast.  Move one finger-width toward the center of the body. Continue to use the  inch (2 cm) overlapping circles to feel your breast as you move slowly up toward your collarbone.  Continue the up and down exam using all three pressures until you reach your armpit.  Write Down What You Find  Write down what is normal for each breast and any changes that you find. Keep a written record with breast changes or normal findings for each breast. By writing this information down, you do not need to depend only on memory for size, tenderness, or location. Write down where you are in your menstrual cycle, if you are still menstruating. If you are having trouble noticing differences in your breasts, do not get discouraged. With time you will become more familiar with the variations in your breasts and more comfortable with the exam. How often should I examine   my breasts? Examine your breasts every month. If you are breastfeeding, the best time to examine your breasts is after a feeding or after using a breast pump. If you menstruate, the best time to examine your breasts is 5-7 days after your period is over. During your period, your breasts are lumpier, and it may be more difficult to notice changes. When should I see my health care provider? See your health care provider if you notice:  A change in shape or size of your breasts or nipples.  A change in the skin of your breast or nipples, such as a reddened or scaly area.  Unusual discharge from your nipples.  A lump or thick area that was not there before.  Pain in your breasts.  Anything that concerns you.  

## 2020-11-03 LAB — CYTOLOGY - PAP
Comment: NEGATIVE
Diagnosis: UNDETERMINED — AB
High risk HPV: NEGATIVE

## 2020-12-08 ENCOUNTER — Ambulatory Visit: Payer: 59 | Admitting: Cardiology

## 2020-12-08 ENCOUNTER — Other Ambulatory Visit: Payer: Self-pay

## 2020-12-08 ENCOUNTER — Encounter: Payer: Self-pay | Admitting: Cardiology

## 2020-12-08 VITALS — BP 114/78 | HR 77 | Ht 67.0 in | Wt 224.0 lb

## 2020-12-08 DIAGNOSIS — Z6835 Body mass index (BMI) 35.0-35.9, adult: Secondary | ICD-10-CM

## 2020-12-08 DIAGNOSIS — E119 Type 2 diabetes mellitus without complications: Secondary | ICD-10-CM

## 2020-12-08 DIAGNOSIS — I493 Ventricular premature depolarization: Secondary | ICD-10-CM

## 2020-12-08 DIAGNOSIS — E7800 Pure hypercholesterolemia, unspecified: Secondary | ICD-10-CM

## 2020-12-08 NOTE — Progress Notes (Signed)
Kerri Dunn Date of Birth: 1961/11/03 MRN: [id number] Primary Care Provider:Tisovec, Adelfa Koh, MD Former Cardiology Providers: Dr. Ambrose Finland, APRN, FNP-C Primary Cardiologist: Tessa Lerner, DO, [facility] (established care 04/10/2020)  Date: 12/08/20 Last Office Visit: 06/05/2020  Chief Complaint  Patient presents with  . PVC  . Follow-up    HPI  Kerri Dunn is a 59 y.o.  female who presents to the office with a chief complaint of " 65-month follow-up for PVC management." Patient's past medical history and cardiovascular risk factors include: Hyperlipidemia, type 2 diabetes, palpitations secondary to PVC burden, obesity.  Patient is here for 85-month follow-up given her underlying PVC burden.  Since last office visit patient states that she is doing well from a cardiovascular standpoint.  No recent hospitalizations or urgent care visits for cardiovascular symptoms.  Patient is currently on diltiazem 180 mg p.o. daily for her underlying PVC.  She is tolerating the medication well.  She has not noticed any reoccurrence of palpitations.  And her recent 24-hour Holter monitor noted significant improvement in overall PVC burden.  ALLERGIES: Allergies  Allergen Reactions  . Codeine Nausea Only    MEDICATION LIST PRIOR TO VISIT: Current Outpatient Medications on File Prior to Visit  Medication Sig Dispense Refill  . aspirin EC 81 MG tablet Take 81 mg by mouth daily.    Kerri Dunn atorvastatin (LIPITOR) 20 MG tablet Take 20 mg by mouth daily.  3  . diltiazem (CARDIZEM CD) 180 MG 24 hr capsule TAKE 1 CAPSULE BY MOUTH EVERY DAY 90 capsule 1  . JARDIANCE 25 MG TABS tablet Take 25 mg by mouth daily.  5  . levocetirizine (XYZAL) 5 MG tablet Take 5 mg by mouth daily.  5  . MELATONIN PO Take by mouth.    . Multiple Vitamins-Minerals (MULTIVITAMIN PO) Take by mouth daily.     No current facility-administered medications on file prior to visit.    PAST MEDICAL HISTORY: Past  Medical History:  Diagnosis Date  . Adenomyosis   . Elevated cholesterol   . History of diabetes mellitus   . Menorrhagia   . Migraines    with aura  . PVC (premature ventricular contraction)     PAST SURGICAL HISTORY: Past Surgical History:  Procedure Laterality Date  . BREAST BIOPSY Right 2006   negative  . BREAST EXCISIONAL BIOPSY Right 2007  . DILATION AND CURETTAGE OF UTERUS  1993   after SAB  . INTRAUTERINE DEVICE (IUD) INSERTION     12/11  . TUBAL LIGATION     BTSP    FAMILY HISTORY: The patient's family history includes Cancer in her mother; Diabetes in her maternal grandfather and mother; Heart attack in her paternal grandfather and paternal grandmother; Heart disease in her father and mother; Hypertension in her father and mother; Skin cancer in her mother; Stroke in her mother.   SOCIAL HISTORY:  The patient  reports that she has never smoked. She has never used smokeless tobacco. She reports previous alcohol use. She reports that she does not use drugs.  Review of Systems  Constitutional: Negative for chills, fever and malaise/fatigue.  HENT: Negative for hoarse voice and nosebleeds.   Eyes: Negative for discharge, double vision and pain.  Cardiovascular: Negative for chest pain, claudication, dyspnea on exertion, leg swelling, near-syncope, orthopnea, palpitations, paroxysmal nocturnal dyspnea and syncope.  Respiratory: Negative for hemoptysis and shortness of breath.   Musculoskeletal: Negative for muscle cramps and myalgias.  Gastrointestinal: Negative for abdominal pain, constipation,  diarrhea, hematemesis, hematochezia, melena, nausea and vomiting.  Neurological: Negative for dizziness and light-headedness.    PHYSICAL EXAM: Vitals with BMI 12/08/2020 10/29/2020 10/29/2020  Height 5\' 7"  5\' 7"  5\' 7"   Weight 224 lbs 220 lbs 220 lbs  BMI 35.08 62.70 35.00  Systolic 938 182 -  Diastolic 78 62 -  Pulse 77 92 -   CONSTITUTIONAL: Well-developed and  well-nourished. No acute distress.  SKIN: Skin is warm and dry. No rash noted. No cyanosis. No pallor. No jaundice HEAD: Normocephalic and atraumatic.  EYES: No scleral icterus MOUTH/THROAT: Moist oral membranes.  NECK: No JVD present. No thyromegaly noted. No carotid bruits  LYMPHATIC: No visible cervical adenopathy.  CHEST Normal respiratory effort. No intercostal retractions  LUNGS: Clear to auscultation bilaterally.  No stridor. No wheezes. No rales.  CARDIOVASCULAR: Regular rate and rhythm, positive X9-B7, extra systolic beats appreciated, no murmurs rubs or gallops appreciated. ABDOMINAL: No apparent ascites.  EXTREMITIES: No peripheral edema  HEMATOLOGIC: No significant bruising NEUROLOGIC: Oriented to person, place, and time. Nonfocal. Normal muscle tone.  PSYCHIATRIC: Normal mood and affect. Normal behavior. Cooperative  CARDIAC DATABASE: EKG: 12/08/2020: Normal sinus rhythm, 79 bpm, axis, without underlying ischemic injury pattern.    Echocardiogram: 10/31/2019: LVEF 67%, mild concentric LVH, normal wall motion, grade 1 diastolic dysfunction, mild AR, mild MR, mild TR.  Stress Testing:  Exercise Sestamibi stress test 10/15/2019: Exercise nuclear stress test was performed using Bruce protocol. Patient reached 7 METS, and 91% of age predicted maximum heart rate. Exercise capacity was low. No chest pain reported. Heart rate and hemodynamic response were normal. Stress EKG demonstrated sinus tachycardia, no ST-T wave abnormalities, and frequent PVC's through rest, exercise, and recovery period. SPECT stress and rest images demonstrate minimal apical thinning on rest and stress images. Stress LVEF 66%. Intermediate risk study due to frequent PVC's.   Heart Catheterization: None  Holter:  48-hour Holter monitor 10/23/2019: Normal sinus rhythm with frequent PVCs (20%) in the form of isolated, couplets, and trigeminal pattern. Symptoms of shortness of breath correlated with PVCs in a  trigeminal pattern and sinus tachycardia. No nsVT or A. fib was noted.  24 hour Holter monitor:  Dominant rhythm normal sinus rhythm, followed by sinus tachycardia (6.7% burden).  Heart rate 57-127 bpm. Average heart rate 78 bpm.  No atrial fibrillation/atrial flutter/supraventricular tachycardia/ventricular tachycardia/high grade AV block, sinus pause greater than or equal to 3 seconds in duration.  558 ventricular ectopic beats accounting for a total ventricular ectopic burden of 0.5%.  Total supraventricular ectopic burden <0.01%.  Number of patient triggered events: 0.  LABORATORY DATA: Done at PCP's office no records to review.  FINAL MEDICATION LIST END OF ENCOUNTER: No orders of the defined types were placed in this encounter.   There are no discontinued medications.   Current Outpatient Medications:  .  aspirin EC 81 MG tablet, Take 81 mg by mouth daily., Disp: , Rfl:  .  atorvastatin (LIPITOR) 20 MG tablet, Take 20 mg by mouth daily., Disp: , Rfl: 3 .  diltiazem (CARDIZEM CD) 180 MG 24 hr capsule, TAKE 1 CAPSULE BY MOUTH EVERY DAY, Disp: 90 capsule, Rfl: 1 .  JARDIANCE 25 MG TABS tablet, Take 25 mg by mouth daily., Disp: , Rfl: 5 .  levocetirizine (XYZAL) 5 MG tablet, Take 5 mg by mouth daily., Disp: , Rfl: 5 .  MELATONIN PO, Take by mouth., Disp: , Rfl:  .  Multiple Vitamins-Minerals (MULTIVITAMIN PO), Take by mouth daily., Disp: , Rfl:  IMPRESSION:    ICD-10-CM   1. PVC (premature ventricular contraction)  I49.3 EKG 12-Lead  2. Hypercholesterolemia  E78.00   3. Controlled type 2 diabetes mellitus without complication, without long-term current use of insulin (HCC)  E11.9   4. Class 2 severe obesity due to excess calories with serious comorbidity and body mass index (BMI) of 35.0 to 35.9 in adult [facility])  E66.01    Z68.35      RECOMMENDATIONS: TIAJUANA LEPPANEN is a 59 y.o. female whose past medical history and cardiovascular risk factors include: Hyperlipidemia,  type 2 diabetes, palpitations secondary to PVC burden, obesity.  Premature ventricular contractions: Stable  In the past patient was noted to have a PVC burden of 20%.  Initially started on metoprolol but it was discontinued secondary to extreme fatigue.    Than transitioned to diltiazem which was better tolerated.   Patient's PVC burden has improved since being on calcium channel blockers.  Symptomatically patient is doing well and has no recurrence of palpitations, chest pain, shortness of breath, fatigue.    Continue current medical therapy.  No changes made during this encounter.    Hypercholesterolemia: Continue statin therapy.  Follow lipids.  Managed by primary team.  Non-insulin-dependent diabetes mellitus type 2: Currently managed per primary team.  Patient educated on the importance of glycemic control as part of her primary prevention for cardiovascular disease.  Obesity, due to excess calories: . Body mass index is 35.08 kg/m.  Kerri Dunn She has gained approximately 9 pounds since the last office visit.  . I reviewed with the patient the importance of diet, regular physical activity/exercise, weight loss.   . Patient is educated on increasing physical activity gradually as tolerated.  With the goal of moderate intensity exercise for 30 minutes a day 5 days a week.  Orders Placed This Encounter  Procedures  . EKG 12-Lead   --Continue cardiac medications as reconciled in final medication list. --Return in about 1 year (around 12/08/2021) for PVC Burden. Or sooner if needed. --Continue follow-up with your primary care physician regarding the management of your other chronic comorbid conditions.  Patient's questions and concerns were addressed to her satisfaction. She voices understanding of the instructions provided during this encounter.   This note was created using a voice recognition software as a result there may be grammatical errors inadvertently enclosed that do not  reflect the nature of this encounter. Every attempt is made to correct such errors.  Tessa Lerner, [place], [facility]  Pager: [phone] Office: [phone]

## 2020-12-23 ENCOUNTER — Other Ambulatory Visit: Payer: Self-pay | Admitting: Cardiology

## 2021-10-30 ENCOUNTER — Other Ambulatory Visit: Payer: Self-pay | Admitting: Obstetrics and Gynecology

## 2021-10-30 ENCOUNTER — Other Ambulatory Visit: Payer: Self-pay | Admitting: Internal Medicine

## 2021-10-30 DIAGNOSIS — Z1231 Encounter for screening mammogram for malignant neoplasm of breast: Secondary | ICD-10-CM

## 2021-11-11 ENCOUNTER — Ambulatory Visit: Payer: 59 | Admitting: Obstetrics and Gynecology

## 2021-12-03 ENCOUNTER — Ambulatory Visit
Admission: RE | Admit: 2021-12-03 | Discharge: 2021-12-03 | Disposition: A | Discharge status: Home / Self Care | Payer: 59 | Source: Ambulatory Visit | Attending: Obstetrics and Gynecology | Admitting: Obstetrics and Gynecology

## 2021-12-03 DIAGNOSIS — Z1231 Encounter for screening mammogram for malignant neoplasm of breast: Secondary | ICD-10-CM

## 2021-12-08 ENCOUNTER — Ambulatory Visit: Payer: 59 | Admitting: Cardiology

## 2021-12-17 ENCOUNTER — Other Ambulatory Visit: Payer: Self-pay

## 2021-12-17 ENCOUNTER — Ambulatory Visit: Payer: 59 | Admitting: Cardiology

## 2021-12-17 ENCOUNTER — Other Ambulatory Visit: Payer: Self-pay | Admitting: Cardiology

## 2021-12-17 ENCOUNTER — Encounter: Payer: Self-pay | Admitting: Cardiology

## 2021-12-17 VITALS — BP 116/79 | HR 92 | Temp 98.0°F | Resp 16 | Ht 67.0 in | Wt 231.0 lb

## 2021-12-17 DIAGNOSIS — E7800 Pure hypercholesterolemia, unspecified: Secondary | ICD-10-CM

## 2021-12-17 DIAGNOSIS — E119 Type 2 diabetes mellitus without complications: Secondary | ICD-10-CM

## 2021-12-17 DIAGNOSIS — I493 Ventricular premature depolarization: Secondary | ICD-10-CM

## 2021-12-17 DIAGNOSIS — I340 Nonrheumatic mitral (valve) insufficiency: Secondary | ICD-10-CM

## 2021-12-17 NOTE — Progress Notes (Signed)
Kerri Dunn Drafts Date of Birth: [birth year] MRN: [id number] Primary Care Provider:Tisovec, Fransico Him, MD Former Cardiology Providers: Dr. Humberto Seals, APRN, FNP-C Primary Cardiologist: Rex Kras, DO, [facility] (established care 04/10/2020)  Date: 12/17/21 Last Office Visit: 12/08/2020  Chief Complaint  Patient presents with   premature ventricular contraction   Follow-up    HPI  Kerri Dunn is a 61 y.o.  female who presents to the office with a chief complaint of " 1 year follow-up for PVC management." Patient's past medical history and cardiovascular risk factors include: Hyperlipidemia, type 2 diabetes, palpitations secondary to PVC burden, obesity.  She presents today for 1 year follow-up given her underlying PVC burden.  In the past she was noted to have a PVC burden of approximately 20% and after being on pharmacological therapy has gone on to 6%.  Patient remains asymptomatic with regards to her PVCs.  EKG today shows normal sinus rhythm without any ectopy.  Over the last 1 year she has not had any hospitalizations or urgent care visits for cardiovascular symptoms.  She denies any anginal discomfort or heart failure symptoms.  Patient states that she has had a busy 2022 after losing her mother, her husband being sick, and helping other family members as she has not given time to herself and therefore her physical activity has gone down, has been indiscretion with oral intake of food, resulting in weight gain.  However she is motivated to change this in the coming year.  ALLERGIES: Allergies  Allergen Reactions   Codeine Nausea Only    MEDICATION LIST PRIOR TO VISIT: Current Outpatient Medications on File Prior to Visit  Medication Sig Dispense Refill   aspirin EC 81 MG tablet Take 81 mg by mouth daily.     atorvastatin (LIPITOR) 20 MG tablet Take 20 mg by mouth daily.  3   diltiazem (CARDIZEM CD) 180 MG 24 hr capsule TAKE 1 CAPSULE BY MOUTH EVERY DAY 90  capsule 3   JARDIANCE 25 MG TABS tablet Take 25 mg by mouth daily.  5   levocetirizine (XYZAL) 5 MG tablet Take 5 mg by mouth daily.  5   MELATONIN PO Take by mouth.     Multiple Vitamins-Minerals (MULTIVITAMIN PO) Take by mouth daily.     No current facility-administered medications on file prior to visit.    PAST MEDICAL HISTORY: Past Medical History:  Diagnosis Date   Adenomyosis    Elevated cholesterol    History of diabetes mellitus    Menorrhagia    Migraines    with aura   PVC (premature ventricular contraction)     PAST SURGICAL HISTORY: Past Surgical History:  Procedure Laterality Date   BREAST BIOPSY Right 2006   negative   BREAST EXCISIONAL BIOPSY Right 2007   DILATION AND CURETTAGE OF UTERUS  1993   after SAB   INTRAUTERINE DEVICE (IUD) INSERTION     12/11   TUBAL LIGATION     BTSP    FAMILY HISTORY: The patient's family history includes Cancer in her mother; Diabetes in her maternal grandfather and mother; Heart attack in her paternal grandfather and paternal grandmother; Heart disease in her father and mother; Hypertension in her father and mother; Skin cancer in her mother; Stroke in her mother.   SOCIAL HISTORY:  The patient  reports that she has never smoked. She has never used smokeless tobacco. She reports that she does not currently use alcohol. She reports that she does not use drugs.  Review of Systems  Constitutional: Negative for chills, fever and malaise/fatigue.  HENT:  Negative for hoarse voice and nosebleeds.   Eyes:  Negative for discharge, double vision and pain.  Cardiovascular:  Negative for chest pain, claudication, dyspnea on exertion, leg swelling, near-syncope, orthopnea, palpitations, paroxysmal nocturnal dyspnea and syncope.  Respiratory:  Negative for hemoptysis and shortness of breath.   Musculoskeletal:  Negative for muscle cramps and myalgias.  Gastrointestinal:  Negative for abdominal pain, constipation, diarrhea, hematemesis,  hematochezia, melena, nausea and vomiting.  Neurological:  Negative for dizziness and light-headedness.   PHYSICAL EXAM: Vitals with BMI 12/17/2021 12/08/2020 10/29/2020  Height 5\' 7"  5\' 7"  5\' 7"   Weight 231 lbs 224 lbs 220 lbs  BMI 36.17 03.47 42.59  Systolic 563 875 643  Diastolic 79 78 62  Pulse 92 77 92   CONSTITUTIONAL: Well-developed and well-nourished. No acute distress.  SKIN: Skin is warm and dry. No rash noted. No cyanosis. No pallor. No jaundice HEAD: Normocephalic and atraumatic.  EYES: No scleral icterus MOUTH/THROAT: Moist oral membranes.  NECK: No JVD present. No thyromegaly noted. No carotid bruits  LYMPHATIC: No visible cervical adenopathy.  CHEST Normal respiratory effort. No intercostal retractions  LUNGS: Clear to auscultation bilaterally.  No stridor. No wheezes. No rales.  CARDIOVASCULAR: Regular rate and rhythm, positive P2-R5, extra systolic beats appreciated, no murmurs rubs or gallops appreciated. ABDOMINAL: Obese, soft, nontender, nondistended, positive bowel sounds in all 4 quadrants, no apparent ascites.  EXTREMITIES: No peripheral edema  HEMATOLOGIC: No significant bruising NEUROLOGIC: Oriented to person, place, and time. Nonfocal. Normal muscle tone.  PSYCHIATRIC: Normal mood and affect. Normal behavior. Cooperative  CARDIAC DATABASE: EKG: 12/17/2021: NSR, 85bpm, normal axis, without underlying injury pattern.   Echocardiogram: 10/31/2019: LVEF 67%, mild concentric LVH, normal wall motion, grade 1 diastolic dysfunction, mild AR, mild MR, mild TR.  Stress Testing:  Exercise Sestamibi stress test 10/15/2019: Exercise nuclear stress test was performed using Bruce protocol. Patient reached 7 METS, and 91% of age predicted maximum heart rate. Exercise capacity was low. No chest pain reported. Heart rate and hemodynamic response were normal. Stress EKG demonstrated sinus tachycardia, no ST-T wave abnormalities, and frequent PVC's through rest, exercise, and  recovery period. SPECT stress and rest images demonstrate minimal apical thinning on rest and stress images. Stress LVEF 66%. Intermediate risk study due to frequent PVC's.   Heart Catheterization: None  Holter:  48-hour Holter monitor 10/23/2019: Normal sinus rhythm with frequent PVCs (20%) in the form of isolated, couplets, and trigeminal pattern.  Symptoms of shortness of breath correlated with PVCs in a trigeminal pattern and sinus tachycardia.  No nsVT or A. fib was noted.  24 hour Holter monitor:  Dominant rhythm normal sinus rhythm, followed by sinus tachycardia (6.7% burden).  Heart rate 57-127 bpm.  Average heart rate 78 bpm.  No atrial fibrillation/atrial flutter/supraventricular tachycardia/ventricular tachycardia/high grade AV block, sinus pause greater than or equal to 3 seconds in duration.  558 ventricular ectopic beats accounting for a total ventricular ectopic burden of 0.5%.  Total supraventricular ectopic burden <0.01%.  Number of patient triggered events: 0.   LABORATORY DATA: External Labs: Collected:/15/2022 provided by Care Everywhere performed at PCPs office. Sodium 144, potassium 5, chloride 104, bicarb 23, BUN 16, creatinine 0.9. AST 24, ALT 25, alkaline phosphatase 81 Hemoglobin 16.2 g/dL, hematocrit 48.2% Total cholesterol 208, triglycerides 200, HDL 53, LDL 115, non-HDL 155, Hemoglobin A1c 6.5  FINAL MEDICATION LIST END OF ENCOUNTER: No orders of the defined types were placed  in this encounter.   There are no discontinued medications.   Current Outpatient Medications:    aspirin EC 81 MG tablet, Take 81 mg by mouth daily., Disp: , Rfl:    atorvastatin (LIPITOR) 20 MG tablet, Take 20 mg by mouth daily., Disp: , Rfl: 3   diltiazem (CARDIZEM CD) 180 MG 24 hr capsule, TAKE 1 CAPSULE BY MOUTH EVERY DAY, Disp: 90 capsule, Rfl: 3   JARDIANCE 25 MG TABS tablet, Take 25 mg by mouth daily., Disp: , Rfl: 5   levocetirizine (XYZAL) 5 MG tablet, Take 5 mg by mouth  daily., Disp: , Rfl: 5   MELATONIN PO, Take by mouth., Disp: , Rfl:    Multiple Vitamins-Minerals (MULTIVITAMIN PO), Take by mouth daily., Disp: , Rfl:   IMPRESSION:    ICD-10-CM   1. PVC (premature ventricular contraction)  I49.3 EKG 12-Lead    PCV ECHOCARDIOGRAM COMPLETE    2. Nonrheumatic mitral valve regurgitation  I34.0 PCV ECHOCARDIOGRAM COMPLETE    3. Hypercholesterolemia  E78.00     4. Type 2 diabetes mellitus without complication, without long-term current use of insulin (HCC)  E11.9     5. Class 2 severe obesity due to excess calories with serious comorbidity and body mass index (BMI) of 36.0 to 36.9 in adult [facility])  E66.01    Z68.36        RECOMMENDATIONS: Kerri Dunn is a 62 y.o. female whose past medical history and cardiovascular risk factors include: Hyperlipidemia, type 2 diabetes, palpitations secondary to PVC burden, obesity.  Premature ventricular contractions:  In 2020 PVC burden approximately 20%. After pharmacological therapy patient's PVC burden has improved with the most recent PVC burden close to 6% as of 04/2020. Patient has tolerated diltiazem better than metoprolol.  No refills requested. EKG notes normal sinus rhythm without ectopy. Symptomatically patient is doing well and has no recurrence of palpitations, chest pain, shortness of breath, fatigue.   Continue current medical therapy.  No changes made during this encounter.    Hypercholesterolemia: Continue statin therapy.  Outside labs independently reviewed from care everywhere.  Patient's LDL is currently not at goal.  Given the fact that she is diabetic I would recommend at least a goal LDL of less than 70 mg/dL.  Managed by primary team.  Non-insulin-dependent diabetes mellitus type 2: Most recent hemoglobin A1c well controlled.  Currently managed per primary team.  Patient educated on the importance of glycemic control as part of her primary prevention for cardiovascular disease.  Obesity,  due to excess calories: Body mass index is 36.18 kg/m.  Patient has gained approximately 7 pounds since last office visit.  Most likely due to reduced physical activity as she has had to take care of multiple family members and grieving after the loss of her mother over the last year. She is motivated to increase physical activity to help facilitate weight loss. Encouraged her to increase physical activity as tolerated with a goal of 30 minutes a day 5 days a week.  Prior history of mild valvular heart disease and given her PVCs would recommend a repeat echocardiogram prior to the next visit which would be a 3-year follow-up study.  Orders Placed This Encounter  Procedures   EKG 12-Lead   PCV ECHOCARDIOGRAM COMPLETE   --Continue cardiac medications as reconciled in final medication list. --Return in about 1 year (around 12/17/2022) for 1 yr PVC burdern, review echo results. . Or sooner if needed. --Continue follow-up with your primary care physician regarding the  management of your other chronic comorbid conditions.  Patient's questions and concerns were addressed to her satisfaction. She voices understanding of the instructions provided during this encounter.   This note was created using a voice recognition software as a result there may be grammatical errors inadvertently enclosed that do not reflect the nature of this encounter. Every attempt is made to correct such errors.  Rex Kras, [place], [facility]  Pager: [phone] Office: [phone]

## 2022-01-06 NOTE — Progress Notes (Signed)
61 y.o. G64P1011 Married White or Caucasian Not Hispanic or Latino female here for annual exam.  No vaginal bleeding. No dyspareunia.    H/O rare urge incontinence, not bothersome. No bowel c/o.  Mom died last year of heart disease.  41 year old sister recently diagnosed with ovarian cancer, stage 3. Lives in [place].   Also has a younger sister and younger brother. Younger sister with thyroid cancer.    68 year old Husband has a.fib, CHF and parkinson's. Medically retired.   Patient's last menstrual period was 11/27/2012.          Sexually active: Yes.    The current method of family planning is tubal ligation.    Exercising: No.  The patient does not participate in regular exercise at present. Smoker:  no  Health Maintenance: Pap:  10/29/20 ASCUS Hr HPV Neg, 05-25-17 neg,  04-24-15 neg HPV HR neg History of abnormal Pap:  no MMG:  12/03/21 density B Bi-rads 1 neg  BMD: none  Colonoscopy: 2020 f/u 5 years polyps  TDaP:  2017  Gardasil: n/a   reports that she has never smoked. She has never used smokeless tobacco. She reports that she does not currently use alcohol. She reports that she does not use drugs. 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 Son is local, engaged.   Past Medical History:  Diagnosis Date   Adenomyosis    Elevated cholesterol    History of diabetes mellitus    Menorrhagia    Migraines    with aura   PVC (premature ventricular contraction)     Past Surgical History:  Procedure Laterality Date   BREAST BIOPSY Right 2006   negative   BREAST EXCISIONAL BIOPSY Right 2007   DILATION AND CURETTAGE OF UTERUS  1993   after SAB   INTRAUTERINE DEVICE (IUD) INSERTION     12/11   TUBAL LIGATION     BTSP    Current Outpatient Medications  Medication Sig Dispense Refill   aspirin EC 81 MG tablet Take 81 mg by mouth daily.     atorvastatin (LIPITOR) 20 MG tablet Take 20 mg by mouth daily.  3   diltiazem (CARDIZEM CD) 180 MG 24 hr  capsule TAKE 1 CAPSULE BY MOUTH EVERY DAY 90 capsule 3   JARDIANCE 25 MG TABS tablet Take 25 mg by mouth daily.  5   levocetirizine (XYZAL) 5 MG tablet Take 5 mg by mouth daily.  5   MELATONIN PO Take by mouth.     Multiple Vitamins-Minerals (MULTIVITAMIN PO) Take by mouth daily.     No current facility-administered medications for this visit.    Family History  Problem Relation Age of Onset   Diabetes Mother    Cancer Mother        colon   Hypertension Mother    Stroke Mother    Heart disease Mother        Afib   Skin cancer Mother    Hypertension Father    Heart disease Father        CHF   Diabetes Maternal Grandfather    Heart attack Paternal Grandmother    Heart attack Paternal Grandfather    Breast cancer Neg Hx     Review of Systems  All other systems reviewed and are negative.  Exam:   BP 110/62    Pulse 72    Resp 14    Ht 5\' 8"  (1.727 m)  Wt 230 lb (104.3 kg)    LMP 11/27/2012    BMI 34.97 kg/m   Weight change: @WEIGHTCHANGE @ Height:   Height: 5\' 8"  (172.7 cm)  Ht Readings from Last 3 Encounters:  01/18/22 5\' 8"  (1.727 m)  12/17/21 5\' 7"  (1.702 m)  12/08/20 5\' 7"  (1.702 m)    General appearance: alert, cooperative and appears stated age Head: Normocephalic, without obvious abnormality, atraumatic Neck: no adenopathy, supple, symmetrical, trachea midline and thyroid normal to inspection and palpation Lungs: clear to auscultation bilaterally Cardiovascular: regular rate and rhythm Breasts: normal appearance, no masses or tenderness Abdomen: soft, non-tender; non distended,  no masses,  no organomegaly Extremities: extremities normal, atraumatic, no cyanosis or edema Skin: Skin color, texture, turgor normal. No rashes or lesions Lymph nodes: Cervical, supraclavicular, and axillary nodes normal. No abnormal inguinal nodes palpated Neurologic: Grossly normal   Pelvic: External genitalia:  no lesions              Urethra:  normal appearing urethra with no  masses, tenderness or lesions              Bartholins and Skenes: normal                 Vagina: normal appearing vagina with normal color and discharge, no lesions              Cervix: no lesions               Bimanual Exam:  Uterus:   no masses or tenderness              Adnexa: no mass, fullness, tenderness               Rectovaginal: Confirms               Anus:  normal sphincter tone, no lesions  Gae Dry chaperoned for the exam.   1. Well woman exam Discussed breast self exam Discussed calcium and vit D intake Pap, colonoscopy and mammogram are UTD Labs with primary

## 2022-01-18 ENCOUNTER — Encounter: Payer: Self-pay | Admitting: Obstetrics and Gynecology

## 2022-01-18 ENCOUNTER — Ambulatory Visit (INDEPENDENT_AMBULATORY_CARE_PROVIDER_SITE_OTHER): Payer: 59 | Admitting: Obstetrics and Gynecology

## 2022-01-18 ENCOUNTER — Other Ambulatory Visit: Payer: Self-pay

## 2022-01-18 VITALS — BP 110/62 | HR 72 | Resp 14 | Ht 68.0 in | Wt 230.0 lb

## 2022-01-18 DIAGNOSIS — Z6833 Body mass index (BMI) 33.0-33.9, adult: Secondary | ICD-10-CM | POA: Insufficient documentation

## 2022-01-18 DIAGNOSIS — Z01419 Encounter for gynecological examination (general) (routine) without abnormal findings: Secondary | ICD-10-CM

## 2022-01-18 DIAGNOSIS — I493 Ventricular premature depolarization: Secondary | ICD-10-CM | POA: Insufficient documentation

## 2022-01-18 NOTE — Patient Instructions (Signed)

## 2022-09-29 ENCOUNTER — Other Ambulatory Visit: Payer: Self-pay | Admitting: Cardiology

## 2022-10-14 ENCOUNTER — Other Ambulatory Visit: Payer: Self-pay | Admitting: Cardiology

## 2022-10-14 DIAGNOSIS — I340 Nonrheumatic mitral (valve) insufficiency: Secondary | ICD-10-CM

## 2022-10-14 DIAGNOSIS — I493 Ventricular premature depolarization: Secondary | ICD-10-CM

## 2022-12-02 ENCOUNTER — Ambulatory Visit: Payer: 59

## 2022-12-02 DIAGNOSIS — I340 Nonrheumatic mitral (valve) insufficiency: Secondary | ICD-10-CM

## 2022-12-02 DIAGNOSIS — I493 Ventricular premature depolarization: Secondary | ICD-10-CM

## 2022-12-10 ENCOUNTER — Other Ambulatory Visit: Payer: 59

## 2022-12-10 ENCOUNTER — Encounter: Payer: Self-pay | Admitting: Cardiology

## 2022-12-10 ENCOUNTER — Ambulatory Visit: Payer: 59 | Admitting: Cardiology

## 2022-12-10 VITALS — BP 105/71 | HR 83 | Resp 17 | Ht 68.0 in | Wt 229.4 lb

## 2022-12-10 DIAGNOSIS — I340 Nonrheumatic mitral (valve) insufficiency: Secondary | ICD-10-CM

## 2022-12-10 DIAGNOSIS — E6609 Other obesity due to excess calories: Secondary | ICD-10-CM

## 2022-12-10 DIAGNOSIS — E119 Type 2 diabetes mellitus without complications: Secondary | ICD-10-CM

## 2022-12-10 DIAGNOSIS — I493 Ventricular premature depolarization: Secondary | ICD-10-CM

## 2022-12-10 DIAGNOSIS — E7800 Pure hypercholesterolemia, unspecified: Secondary | ICD-10-CM

## 2022-12-10 MED ORDER — DILTIAZEM HCL ER COATED BEADS 180 MG PO CP24: ORAL_CAPSULE | ORAL | 1 refills | Status: DC

## 2022-12-10 NOTE — Progress Notes (Signed)
Kerri Dunn Date of Birth: 1961/09/07 MRN: [id number] Primary Care Provider:Tisovec, Fransico Him, MD Former Cardiology Providers: Dr. Humberto Seals, APRN, FNP-C Primary Cardiologist: Rex Kras, DO, [facility] (established care 04/10/2020)  Date: 12/10/22 Last Office Visit: 12/17/2021  Chief Complaint  Patient presents with   PVC   Follow-up    1 year and review echo results    HPI  Kerri Dunn is a 61 y.o.  female whose past medical history and cardiovascular risk factors include: Hyperlipidemia, type 2 diabetes, palpitations secondary to PVC burden, obesity.  Patient presents today for 1 year follow-up visit for PVC management and discuss test results.  She used to have a PVC burden approximately 20% and after initiating pharmacological therapy had improved to 6%.  She is not having any palpitations on current medical therapy.  No hospitalizations or urgent care visits for cardiovascular reasons since last office encounter.  ALLERGIES: Allergies  Allergen Reactions   Codeine Nausea Only    MEDICATION LIST PRIOR TO VISIT: Current Outpatient Medications on File Prior to Visit  Medication Sig Dispense Refill   atorvastatin (LIPITOR) 20 MG tablet Take 20 mg by mouth daily.  3   JARDIANCE 25 MG TABS tablet Take 25 mg by mouth daily.  5   levocetirizine (XYZAL) 5 MG tablet Take 5 mg by mouth daily.  5   MELATONIN PO Take by mouth.     Multiple Vitamins-Minerals (MULTIVITAMIN PO) Take by mouth daily.     Potassium 99 MG TABS Take 99 mg by mouth daily.     Magnesium 400 MG TABS Take 400 mg by mouth daily.     No current facility-administered medications on file prior to visit.    PAST MEDICAL HISTORY: Past Medical History:  Diagnosis Date   Adenomyosis    Elevated cholesterol    History of diabetes mellitus    Menorrhagia    Migraines    with aura   PVC (premature ventricular contraction)     PAST SURGICAL HISTORY: Past Surgical History:   Procedure Laterality Date   BREAST BIOPSY Right 2006   negative   BREAST EXCISIONAL BIOPSY Right 2007   DILATION AND CURETTAGE OF UTERUS  1993   after SAB   INTRAUTERINE DEVICE (IUD) INSERTION     12/11   TUBAL LIGATION     BTSP    FAMILY HISTORY: The patient's family history includes Colon cancer (age of onset: 90) in her mother; Diabetes in her maternal grandfather and mother; Heart attack in her paternal grandfather and paternal grandmother; Heart disease in her father and mother; Hypertension in her father and mother; Ovarian cancer (age of onset: 23) in her sister; Skin cancer in her mother; Stroke in her mother.   SOCIAL HISTORY:  The patient  reports that she has never smoked. She has never used smokeless tobacco. She reports that she does not currently use alcohol. She reports that she does not use drugs.  Review of Systems  Cardiovascular:  Negative for chest pain, claudication, dyspnea on exertion, irregular heartbeat, leg swelling, near-syncope, orthopnea, palpitations, paroxysmal nocturnal dyspnea and syncope.  Respiratory:  Negative for shortness of breath.   Hematologic/Lymphatic: Negative for bleeding problem.  Musculoskeletal:  Negative for muscle cramps and myalgias.  Neurological:  Negative for dizziness and light-headedness.    PHYSICAL EXAM:    12/10/2022   10:22 AM 01/18/2022    3:08 PM 12/17/2021    2:42 PM  Vitals with BMI  Height '5\' 8"'$  '5\' 8"'$   $'5\' 7"'i$   Weight 229 lbs 6 oz 230 lbs 231 lbs  BMI 34.89 23.53 61.44  Systolic 315 400 867  Diastolic 71 62 79  Pulse 83 72 92   Physical Exam  Constitutional: No distress.  Age appropriate, hemodynamically stable.   Neck: No JVD present.  Cardiovascular: Normal rate, regular rhythm, S1 normal, S2 normal, intact distal pulses and normal pulses. Exam reveals no gallop, no S3 and no S4.  No murmur heard. Pulmonary/Chest: Effort normal and breath sounds normal. No stridor. She has no wheezes. She has no rales.   Abdominal: Soft. Bowel sounds are normal. She exhibits no distension. There is no abdominal tenderness.  Musculoskeletal:        General: No edema.     Cervical back: Neck supple.  Neurological: She is alert and oriented to person, place, and time. She has intact cranial nerves (2-12).  Skin: Skin is warm and moist.   CARDIAC DATABASE: EKG: 12/10/2022: Normal sinus rhythm, 75 bpm, normal axis, without underlying ischemia or injury pattern.  Echocardiogram: 12/02/2022:  Normal LV systolic function with visual EF 60-65%. Left ventricle cavity is normal in size. Normal global wall motion. Normal diastolic filling pattern, normal LAP. Mild left ventricular hypertrophy.  Trace aortic regurgitation.  Compared to 10/31/2019 G1DD is now normal and mild AR/MR/TR have improved  otherwise no significant change.    Stress Testing:  Exercise Sestamibi stress test 10/15/2019: Exercise nuclear stress test was performed using Bruce protocol. Patient reached 7 METS, and 91% of age predicted maximum heart rate. Exercise capacity was low. No chest pain reported. Heart rate and hemodynamic response were normal. Stress EKG demonstrated sinus tachycardia, no ST-T wave abnormalities, and frequent PVC's through rest, exercise, and recovery period. SPECT stress and rest images demonstrate minimal apical thinning on rest and stress images. Stress LVEF 66%. Intermediate risk study due to frequent PVC's.   Heart Catheterization: None  Holter:  48-hour Holter monitor 10/23/2019: Normal sinus rhythm with frequent PVCs (20%) in the form of isolated, couplets, and trigeminal pattern.  Symptoms of shortness of breath correlated with PVCs in a trigeminal pattern and sinus tachycardia.  No nsVT or A. fib was noted.  24 hour Holter monitor:  Dominant rhythm normal sinus rhythm, followed by sinus tachycardia (6.7% burden).  Heart rate 57-127 bpm.  Average heart rate 78 bpm.  No atrial fibrillation/atrial  flutter/supraventricular tachycardia/ventricular tachycardia/high grade AV block, sinus pause greater than or equal to 3 seconds in duration.  558 ventricular ectopic beats accounting for a total ventricular ectopic burden of 0.5%.  Total supraventricular ectopic burden <0.01%.  Number of patient triggered events: 0.   LABORATORY DATA: External Labs: Collected:2022 provided by [facility] Everywhere performed at PCPs office. Sodium 144, potassium 5, chloride 104, bicarb 23, BUN 16, creatinine 0.9. AST 24, ALT 25, alkaline phosphatase 81 Hemoglobin 16.2 g/dL, hematocrit 48.2% Total cholesterol 208, triglycerides 200, HDL 53, LDL 115, non-HDL 155, Hemoglobin A1c 6.5  FINAL MEDICATION LIST END OF ENCOUNTER: Meds ordered this encounter  Medications   diltiazem (CARDIZEM CD) 180 MG 24 hr capsule    Sig: TAKE 1 CAPSULE BY MOUTH EVERY DAY    Dispense:  90 capsule    Refill:  1    Medications Discontinued During This Encounter  Medication Reason   aspirin EC 81 MG tablet Discontinued by provider   diltiazem (CARDIZEM CD) 180 MG 24 hr capsule Reorder     Current Outpatient Medications:    atorvastatin (LIPITOR) 20 MG tablet, Take 20  mg by mouth daily., Disp: , Rfl: 3   JARDIANCE 25 MG TABS tablet, Take 25 mg by mouth daily., Disp: , Rfl: 5   levocetirizine (XYZAL) 5 MG tablet, Take 5 mg by mouth daily., Disp: , Rfl: 5   MELATONIN PO, Take by mouth., Disp: , Rfl:    Multiple Vitamins-Minerals (MULTIVITAMIN PO), Take by mouth daily., Disp: , Rfl:    Potassium 99 MG TABS, Take 99 mg by mouth daily., Disp: , Rfl:    diltiazem (CARDIZEM CD) 180 MG 24 hr capsule, TAKE 1 CAPSULE BY MOUTH EVERY DAY, Disp: 90 capsule, Rfl: 1   Magnesium 400 MG TABS, Take 400 mg by mouth daily., Disp: , Rfl:   IMPRESSION:    ICD-10-CM   1. PVC (premature ventricular contraction)  I49.3 EKG 12-Lead    LONG TERM MONITOR (3-14 DAYS)    diltiazem (CARDIZEM CD) 180 MG 24 hr capsule    2. Nonrheumatic mitral valve  regurgitation  I34.0 EKG 12-Lead    3. Hypercholesterolemia  E78.00     4. Type 2 diabetes mellitus without complication, without long-term current use of insulin (HCC)  E11.9     5. Class 1 obesity due to excess calories with serious comorbidity and body mass index (BMI) of 34.0 to 34.9 in adult  E66.09    Z68.34        RECOMMENDATIONS: ELIANIS FISCHBACH is a 61 y.o. female whose past medical history and cardiovascular risk factors include: Hyperlipidemia, type 2 diabetes, palpitations secondary to PVC burden, obesity.  PVC (premature ventricular contraction) Asymptomatic. EKG today shows sinus rhythm without ectopy. Continue current medical therapy. Would like to repeat the Zio patch prior to the next office visit to reevaluate PVC burden.  Nonrheumatic mitral valve regurgitation Asymptomatic. Monitor for now  Hypercholesterolemia Currently on atorvastatin.   She denies myalgia or other side effects. Currently managed by primary care provider.  Class 1 obesity due to excess calories with serious comorbidity and body mass index (BMI) of 34.0 to 34.9 in adult Body mass index is 34.88 kg/m. I reviewed with the patient the importance of diet, regular physical activity/exercise, weight loss.   Patient is educated on increasing physical activity gradually as tolerated.  With the goal of moderate intensity exercise for 30 minutes a day 5 days a week.  Orders Placed This Encounter  Procedures   LONG TERM MONITOR (3-14 DAYS)   EKG 12-Lead   --Continue cardiac medications as reconciled in final medication list. --Return in about 1 year (around 12/19/2023) for Follow up PVCs / s/p monitor . Or sooner if needed. --Continue follow-up with your primary care physician regarding the management of your other chronic comorbid conditions.  Patient's questions and concerns were addressed to her satisfaction. She voices understanding of the instructions provided during this encounter.   This  note was created using a voice recognition software as a result there may be grammatical errors inadvertently enclosed that do not reflect the nature of this encounter. Every attempt is made to correct such errors.  Rex Kras, [place], [facility]  Pager: [phone] Office: [phone]

## 2022-12-17 ENCOUNTER — Ambulatory Visit: Payer: 59 | Admitting: Cardiology

## 2022-12-17 ENCOUNTER — Encounter: Payer: Self-pay | Admitting: Cardiology

## 2023-01-19 ENCOUNTER — Ambulatory Visit: Payer: 59 | Admitting: Obstetrics and Gynecology

## 2023-01-26 ENCOUNTER — Ambulatory Visit: Payer: 59 | Admitting: Obstetrics and Gynecology

## 2023-02-02 NOTE — Progress Notes (Deleted)
62 y.o. G87P1011 Married White or Caucasian Not Hispanic or Latino female here for annual exam.      Patient's last menstrual period was 12/14/2011.          Sexually active: {yes no:314532}  The current method of family planning is {contraception:315051}.    Exercising: {yes no:314532}  {types:19826} Smoker:  {YES [id number]  Health Maintenance: Pap:  10/29/20 ASCUS Hr HPV Neg, 05-25-17 neg,  04-24-15 neg HPV HR neg History of abnormal Pap:  no MMG:  12/03/21 density B Bi-rads 1 neg  BMD:   none  Colonoscopy:2020 f/u 5 years polyps  TDaP:  2017  Gardasil: n/a   reports that she has never smoked. She has never used smokeless tobacco. She reports that she does not currently use alcohol. She reports that she does not use drugs.  Past Medical History:  Diagnosis Date   Adenomyosis    Elevated cholesterol    History of diabetes mellitus    Menorrhagia    Migraines    with aura   PVC (premature ventricular contraction)     Past Surgical History:  Procedure Laterality Date   BREAST BIOPSY Right 2006   negative   BREAST EXCISIONAL BIOPSY Right 2007   DILATION AND CURETTAGE OF UTERUS  1993   after SAB   INTRAUTERINE DEVICE (IUD) INSERTION     12/11   TUBAL LIGATION     BTSP    Current Outpatient Medications  Medication Sig Dispense Refill   atorvastatin (LIPITOR) 20 MG tablet Take 20 mg by mouth daily.  3   diltiazem (CARDIZEM CD) 180 MG 24 hr capsule TAKE 1 CAPSULE BY MOUTH EVERY DAY 90 capsule 1   JARDIANCE 25 MG TABS tablet Take 25 mg by mouth daily.  5   levocetirizine (XYZAL) 5 MG tablet Take 5 mg by mouth daily.  5   Magnesium 400 MG TABS Take 400 mg by mouth daily.     MELATONIN PO Take by mouth.     Multiple Vitamins-Minerals (MULTIVITAMIN PO) Take by mouth daily.     Potassium 99 MG TABS Take 99 mg by mouth daily.     No current facility-administered medications for this visit.    Family History  Problem Relation Age of Onset   Diabetes Mother    Hypertension  Mother    Stroke Mother    Heart disease Mother        Afib   Skin cancer Mother    Colon cancer Mother 68   Hypertension Father    Heart disease Father        CHF   Ovarian cancer Sister 92   Diabetes Maternal Grandfather    Heart attack Paternal Grandmother    Heart attack Paternal Grandfather    Breast cancer Neg Hx     Review of Systems  Exam:   LMP 12/14/2011   Weight change: @WEIGHTCHANGE$ @ Height:      Ht Readings from Last 3 Encounters:  12/10/22 5' 8"$  (1.727 m)  01/18/22 5' 8"$  (1.727 m)  12/17/21 5' 7"$  (1.702 m)    General appearance: alert, cooperative and appears stated age Head: Normocephalic, without obvious abnormality, atraumatic Neck: no adenopathy, supple, symmetrical, trachea midline and thyroid {CHL AMB PHY EX THYROID NORM [phone]::"normal to inspection and palpation"} Lungs: clear to auscultation bilaterally Cardiovascular: regular rate and rhythm Breasts: {Exam; breast:13139::"normal appearance, no masses or tenderness"} Abdomen: soft, non-tender; non distended,  no masses,  no organomegaly Extremities: extremities normal, atraumatic, no cyanosis or  edema Skin: Skin color, texture, turgor normal. No rashes or lesions Lymph nodes: Cervical, supraclavicular, and axillary nodes normal. No abnormal inguinal nodes palpated Neurologic: Grossly normal   Pelvic: External genitalia:  no lesions              Urethra:  normal appearing urethra with no masses, tenderness or lesions              Bartholins and Skenes: normal                 Vagina: normal appearing vagina with normal color and discharge, no lesions              Cervix: {CHL AMB PHY EX CERVIX NORM DEFAULT:[phone]::"no lesions"}               Bimanual Exam:  Uterus:  {CHL AMB PHY EX UTERUS NORM DEFAULT:[phone]::"normal size, contour, position, consistency, mobility, non-tender"}              Adnexa: {CHL AMB PHY EX ADNEXA NO MASS [phone]::"no mass, fullness,  tenderness"}               Rectovaginal: Confirms               Anus:  normal sphincter tone, no lesions  *** chaperoned for the exam.  A:  Well Woman with normal exam  P:

## 2023-02-03 ENCOUNTER — Ambulatory Visit: Payer: 59 | Admitting: Obstetrics and Gynecology

## 2023-02-10 ENCOUNTER — Ambulatory Visit: Payer: 59 | Admitting: Obstetrics and Gynecology

## 2023-03-10 ENCOUNTER — Other Ambulatory Visit: Payer: Self-pay | Admitting: Obstetrics and Gynecology

## 2023-03-10 DIAGNOSIS — Z1231 Encounter for screening mammogram for malignant neoplasm of breast: Secondary | ICD-10-CM

## 2023-03-11 ENCOUNTER — Ambulatory Visit
Admission: RE | Admit: 2023-03-11 | Discharge: 2023-03-11 | Disposition: A | Discharge status: Home / Self Care | Payer: 59 | Source: Ambulatory Visit | Attending: Obstetrics and Gynecology | Admitting: Obstetrics and Gynecology

## 2023-03-11 DIAGNOSIS — Z1231 Encounter for screening mammogram for malignant neoplasm of breast: Secondary | ICD-10-CM

## 2023-03-30 NOTE — Progress Notes (Signed)
62 y.o. G61P1011 Married White or Caucasian Not Hispanic or Latino female here for annual exam.  No vaginal bleeding, no dyspareunia. No bowel or bladder issues.   Patient sister was diagnosed with endometrial cancer stage 4 last year. Patient states that her sister is doing well s/p chemo and surgery.    Husband has a.fib, CHF and parkinson's. He has gained weight and has mobility issues. On disability.   Patient's last menstrual period was 12/14/2011.          Sexually active: Yes.    The current method of family planning is post menopausal status.    Exercising: No.  The patient does not participate in regular exercise at present. Smoker:  no  Health Maintenance: Pap:    10/29/20 ASCUS Hr HPV Neg, 05-25-17 neg,  04-24-15 neg HPV HR neg  History of abnormal Pap:  no MMG:  03/14/23 bi-rads 1 neg  BMD:   none Colonoscopy: 2020 f/u 5 years polyps  TDaP:  2017  Gardasil: n/a   reports that she has never smoked. She has never used smokeless tobacco. She reports that she does not currently use alcohol. She reports that she does not use drugs. 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 Son is local, married, no kids.   Past Medical History:  Diagnosis Date   Adenomyosis    Elevated cholesterol    History of diabetes mellitus    Menorrhagia    Migraines    with aura   PVC (premature ventricular contraction)     Past Surgical History:  Procedure Laterality Date   BREAST BIOPSY Right 2006   negative   BREAST EXCISIONAL BIOPSY Right 2007   DILATION AND CURETTAGE OF UTERUS  1993   after SAB   INTRAUTERINE DEVICE (IUD) INSERTION     12/11   TUBAL LIGATION     BTSP    Current Outpatient Medications  Medication Sig Dispense Refill   atorvastatin (LIPITOR) 20 MG tablet Take 20 mg by mouth daily.  3   diltiazem (CARDIZEM CD) 180 MG 24 hr capsule TAKE 1 CAPSULE BY MOUTH EVERY DAY 90 capsule 1   JARDIANCE 25 MG TABS tablet Take 25 mg by mouth daily.  5    levocetirizine (XYZAL) 5 MG tablet Take 5 mg by mouth daily.  5   Magnesium 400 MG TABS Take 400 mg by mouth daily.     MELATONIN PO Take by mouth.     Multiple Vitamins-Minerals (MULTIVITAMIN PO) Take by mouth daily.     Potassium 99 MG TABS Take 99 mg by mouth daily.     No current facility-administered medications for this visit.    Family History  Problem Relation Age of Onset   Diabetes Mother    Hypertension Mother    Stroke Mother    Heart disease Mother        Afib   Skin cancer Mother    Colon cancer Mother 22   Hypertension Father    Heart disease Father        CHF   Endometrial cancer Sister 50   Diabetes Maternal Grandfather    Heart attack Paternal Grandmother    Heart attack Paternal Grandfather    Breast cancer Neg Hx     Review of Systems  All other systems reviewed and are negative.   Exam:   BP 110/62   Pulse 77   Ht  (1.727 m)   Wt 227 lb (103 kg)  LMP 12/14/2011   SpO2 100%   BMI 34.52 kg/m   Weight change: @ Height:   Height:  (172.7 cm)  Ht Readings from Last 3 Encounters:  04/05/23  (1.727 m)  12/10/22  (1.727 m)  01/18/22  (1.727 m)    General appearance: alert, cooperative and appears stated age Head: Normocephalic, without obvious abnormality, atraumatic Neck: no adenopathy, supple, symmetrical, trachea midline and thyroid normal to inspection and palpation Lungs: clear to auscultation bilaterally Cardiovascular: regular rate and rhythm Breasts: normal appearance, no masses or tenderness Abdomen: soft, non-tender; non distended,  no masses,  no organomegaly Extremities: extremities normal, atraumatic, no cyanosis or edema Skin: Skin color, texture, turgor normal. No rashes or lesions Lymph nodes: Cervical, supraclavicular, and axillary nodes normal. No abnormal inguinal nodes palpated Neurologic: Grossly normal   Pelvic: External genitalia:  no lesions              Urethra:  normal appearing  urethra with no masses, tenderness or lesions              Bartholins and Skenes: normal                 Vagina: normal appearing vagina with normal color and discharge, no lesions              Cervix: no lesions and friable with the pap               Bimanual Exam:  Uterus:   no masses or tenderness              Adnexa: no mass, fullness, tenderness               Rectovaginal: Confirms               Anus:  normal sphincter tone, no lesions  Carolynn Serve, CMa chaperoned for the exam.  1. Well woman exam Discussed breast self exam Discussed calcium and vit D intake Mammogram and colonoscopy are UTD Labs with primary  2. Screening for cervical cancer - Cytology - PAP

## 2023-04-05 ENCOUNTER — Ambulatory Visit (INDEPENDENT_AMBULATORY_CARE_PROVIDER_SITE_OTHER): Payer: 59 | Admitting: Obstetrics and Gynecology

## 2023-04-05 ENCOUNTER — Encounter: Payer: Self-pay | Admitting: Obstetrics and Gynecology

## 2023-04-05 ENCOUNTER — Other Ambulatory Visit (HOSPITAL_COMMUNITY)
Admission: RE | Admit: 2023-04-05 | Discharge: 2023-04-05 | Disposition: A | Discharge status: Home / Self Care | Payer: 59 | Source: Ambulatory Visit | Attending: Obstetrics and Gynecology | Admitting: Obstetrics and Gynecology

## 2023-04-05 VITALS — BP 110/62 | HR 77 | Ht 68.0 in | Wt 227.0 lb

## 2023-04-05 DIAGNOSIS — Z01419 Encounter for gynecological examination (general) (routine) without abnormal findings: Secondary | ICD-10-CM | POA: Diagnosis not present

## 2023-04-05 DIAGNOSIS — Z124 Encounter for screening for malignant neoplasm of cervix: Secondary | ICD-10-CM | POA: Insufficient documentation

## 2023-04-05 DIAGNOSIS — Z800 Family history of malignant neoplasm of digestive organs: Secondary | ICD-10-CM | POA: Insufficient documentation

## 2023-04-05 DIAGNOSIS — Z1211 Encounter for screening for malignant neoplasm of colon: Secondary | ICD-10-CM | POA: Insufficient documentation

## 2023-04-05 NOTE — Patient Instructions (Signed)

## 2023-04-08 LAB — CYTOLOGY - PAP
Comment: NEGATIVE
Diagnosis: NEGATIVE
Diagnosis: REACTIVE
High risk HPV: NEGATIVE

## 2023-04-13 ENCOUNTER — Other Ambulatory Visit: Payer: Self-pay

## 2023-04-13 DIAGNOSIS — R87618 Other abnormal cytological findings on specimens from cervix uteri: Secondary | ICD-10-CM

## 2023-05-04 ENCOUNTER — Other Ambulatory Visit (HOSPITAL_COMMUNITY)
Admission: RE | Admit: 2023-05-04 | Discharge: 2023-05-04 | Disposition: A | Discharge status: Home / Self Care | Payer: 59 | Source: Ambulatory Visit | Attending: Obstetrics and Gynecology | Admitting: Obstetrics and Gynecology

## 2023-05-04 ENCOUNTER — Encounter: Payer: Self-pay | Admitting: Obstetrics and Gynecology

## 2023-05-04 ENCOUNTER — Ambulatory Visit (INDEPENDENT_AMBULATORY_CARE_PROVIDER_SITE_OTHER): Payer: 59 | Admitting: Obstetrics and Gynecology

## 2023-05-04 VITALS — BP 110/64 | HR 69 | Ht 67.32 in | Wt 225.0 lb

## 2023-05-04 DIAGNOSIS — R87618 Other abnormal cytological findings on specimens from cervix uteri: Secondary | ICD-10-CM

## 2023-05-04 NOTE — Progress Notes (Signed)
GYNECOLOGY  VISIT   HPI: 62 y.o.   Married White or Caucasian Not Hispanic or Latino  female   [phone] with Patient's last menstrual period was 12/14/2011.   here for endo bx. Her recent pap returned with endometrial cells. She has not had any vaginal bleeding.  Sister with endometrial cancer.    GYNECOLOGIC HISTORY: Patient's last menstrual period was 12/14/2011. Contraception:pmp Menopausal hormone therapy: none        OB History     Gravida  2   Para  1   Term  1   Preterm      AB  1   Living  1      SAB  1   IAB      Ectopic      Multiple      Live Births  1              Patient Active Problem List   Diagnosis Date Noted   Colon cancer screening 04/05/2023   Family history of malignant neoplasm of digestive organs 04/05/2023   Body mass index (BMI) 33.0-33.9, adult 01/18/2022   Multiple premature ventricular complexes 01/18/2022   History of diabetes mellitus    Elevated cholesterol    Left arm pain 08/07/2019   Hypercholesterolemia 08/07/2019   Family history of ischemic heart disease (IHD) 07/12/2019   Type 2 diabetes mellitus without complications (HCC) 03/12/2016   Benign neoplasm of colon 07/25/2014   Metabolic syndrome 01/23/2014   Seasonal allergic rhinitis 11/10/2010   BRONCHITIS, ACUTE 07/02/2009    Past Medical History:  Diagnosis Date   Adenomyosis    Elevated cholesterol    History of diabetes mellitus    Menorrhagia    Migraines    with aura   PVC (premature ventricular contraction)     Past Surgical History:  Procedure Laterality Date   BREAST BIOPSY Right 2006   negative   BREAST EXCISIONAL BIOPSY Right 2007   DILATION AND CURETTAGE OF UTERUS  1993   after SAB   INTRAUTERINE DEVICE (IUD) INSERTION     12/11   TUBAL LIGATION     BTSP    Current Outpatient Medications  Medication Sig Dispense Refill   atorvastatin (LIPITOR) 20 MG tablet Take 20 mg by mouth daily.  3   diltiazem (CARDIZEM CD) 180 MG 24 hr  capsule TAKE 1 CAPSULE BY MOUTH EVERY DAY 90 capsule 1   JARDIANCE 25 MG TABS tablet Take 25 mg by mouth daily.  5   levocetirizine (XYZAL) 5 MG tablet Take 5 mg by mouth daily.  5   Magnesium 400 MG TABS Take 400 mg by mouth daily.     MELATONIN PO Take by mouth.     Multiple Vitamins-Minerals (MULTIVITAMIN PO) Take by mouth daily.     Potassium 99 MG TABS Take 99 mg by mouth daily.     No current facility-administered medications for this visit.     ALLERGIES: Codeine  Family History  Problem Relation Age of Onset   Diabetes Mother    Hypertension Mother    Stroke Mother    Heart disease Mother        Afib   Skin cancer Mother    Colon cancer Mother 73   Hypertension Father    Heart disease Father        CHF   Endometrial cancer Sister 27   Diabetes Maternal Grandfather    Heart attack Paternal Grandmother    Heart attack Paternal  Grandfather    Breast cancer Neg Hx     Social History   Socioeconomic History   Marital status: Married    Spouse name: Brett Canales   Number of children: 1   Years of education: Not on file   Highest education level: Not on file  Occupational History   Not on file  Tobacco Use   Smoking status: Never   Smokeless tobacco: Never  Vaping Use   Vaping Use: Never used  Substance and Sexual Activity   Alcohol use: Not Currently   Drug use: No   Sexual activity: Yes    Partners: Male    Birth control/protection: Post-menopausal    Comment: BTL  Other Topics Concern   Not on file  Social History Narrative   Not on file   Social Determinants of Health   Financial Resource Strain: Not on file  Food Insecurity: Not on file  Transportation Needs: Not on file  Physical Activity: Not on file  Stress: Not on file  Social Connections: Not on file  Intimate Partner Violence: Not on file    Review of Systems  All other systems reviewed and are negative.   PHYSICAL EXAMINATION:    BP 110/64   Pulse 69   Ht 5' 7.32" (1.71 m)   Wt 225  lb (102.1 kg)   LMP 12/14/2011   SpO2 100%   BMI 34.90 kg/m     General appearance: alert, cooperative and appears stated age  Pelvic: External genitalia:  no lesions              Urethra:  normal appearing urethra with no masses, tenderness or lesions              Bartholins and Skenes: normal                 Vagina: normal appearing vagina with normal color and discharge, no lesions              Cervix: no lesions  The risks of endometrial biopsy were reviewed and a consent was obtained.  A speculum was placed in the vagina and the cervix was cleansed with betadine. A tenaculum was placed on the cervix and the pipelle was placed into the endometrial cavity. The uterus sounded to ~7 cm. The endometrial biopsy was performed, taking care to get a representative sample, sampling 360 degrees of the uterine cavity. Minimal  tissue was obtained. The tenaculum and speculum were removed. There were no complications.              Chaperone, Carolynn Serve, CMA was present for exam.  1. Unexplained endometrial cells on cervical cytology - Endometrial biopsy - Surgical pathology( [place]/ [facility])

## 2023-05-04 NOTE — Addendum Note (Signed)
Addended by: Tobi Bastos on: 05/04/2023 11:23 AM   Modules accepted: Level of Service

## 2023-05-04 NOTE — Patient Instructions (Signed)

## 2023-05-10 ENCOUNTER — Other Ambulatory Visit: Payer: Self-pay | Admitting: Obstetrics and Gynecology

## 2023-05-10 DIAGNOSIS — R87618 Other abnormal cytological findings on specimens from cervix uteri: Secondary | ICD-10-CM

## 2023-05-10 LAB — SURGICAL PATHOLOGY

## 2023-05-17 ENCOUNTER — Telehealth: Payer: Self-pay

## 2023-05-17 DIAGNOSIS — R87618 Other abnormal cytological findings on specimens from cervix uteri: Secondary | ICD-10-CM

## 2023-05-17 NOTE — Telephone Encounter (Signed)
Order placed and pt notified via mychart to call and schedule at her earliest convenience.

## 2023-05-17 NOTE — Telephone Encounter (Signed)
-----   Message from Hulen Luster sent at 05/17/2023  3:17 PM EDT ----- Ladona Ridgel, I talked with patient and she wants to go outside facility for u/s - our only opening before JJ leaves is this Thursday 6/6 and patient could not do she is out of town and she does not want to take a chance on waiting for cancellation with [place] and miss seeing JJ. She is aware order has to be placed and knows we need to sched ov once date is given of u/s.  Thanks :) ----- Message ----- From: Selinda Eon, CMA Sent: 05/11/2023   3:57 PM EDT To: [facility] Appoint[facility]  Per Lennon Alstrom. Please schedule pt for pelvic US, order placed. Thanks.

## 2023-05-24 NOTE — Telephone Encounter (Signed)
Pt scheduled to have US done @ [facility] on 05/26/23 and f/u OV scheduled w/ JJ on 6/19. Will route to provider and close.

## 2023-05-26 ENCOUNTER — Ambulatory Visit (HOSPITAL_COMMUNITY)
Admission: RE | Admit: 2023-05-26 | Discharge: 2023-05-26 | Disposition: A | Discharge status: Home / Self Care | Payer: 59 | Source: Ambulatory Visit | Attending: Obstetrics and Gynecology | Admitting: Obstetrics and Gynecology

## 2023-05-26 DIAGNOSIS — R87618 Other abnormal cytological findings on specimens from cervix uteri: Secondary | ICD-10-CM | POA: Diagnosis present

## 2023-06-01 ENCOUNTER — Ambulatory Visit: Payer: 59 | Admitting: Obstetrics and Gynecology

## 2023-08-25 IMAGING — MG MM DIGITAL SCREENING BILAT W/ TOMO AND CAD
6 of 10 series · 6 of 30 positions shown · non-contrast
Comparison: Previous exam(s).

CLINICAL DATA: Screening.

EXAM:
DIGITAL SCREENING BILATERAL MAMMOGRAM WITH TOMOSYNTHESIS AND CAD
TECHNIQUE: Bilateral screening digital craniocaudal and mediolateral oblique
mammograms were obtained. Bilateral screening digital breast
tomosynthesis was performed. The images were evaluated with
computer-aided detection.

[R MLO synth-2D]
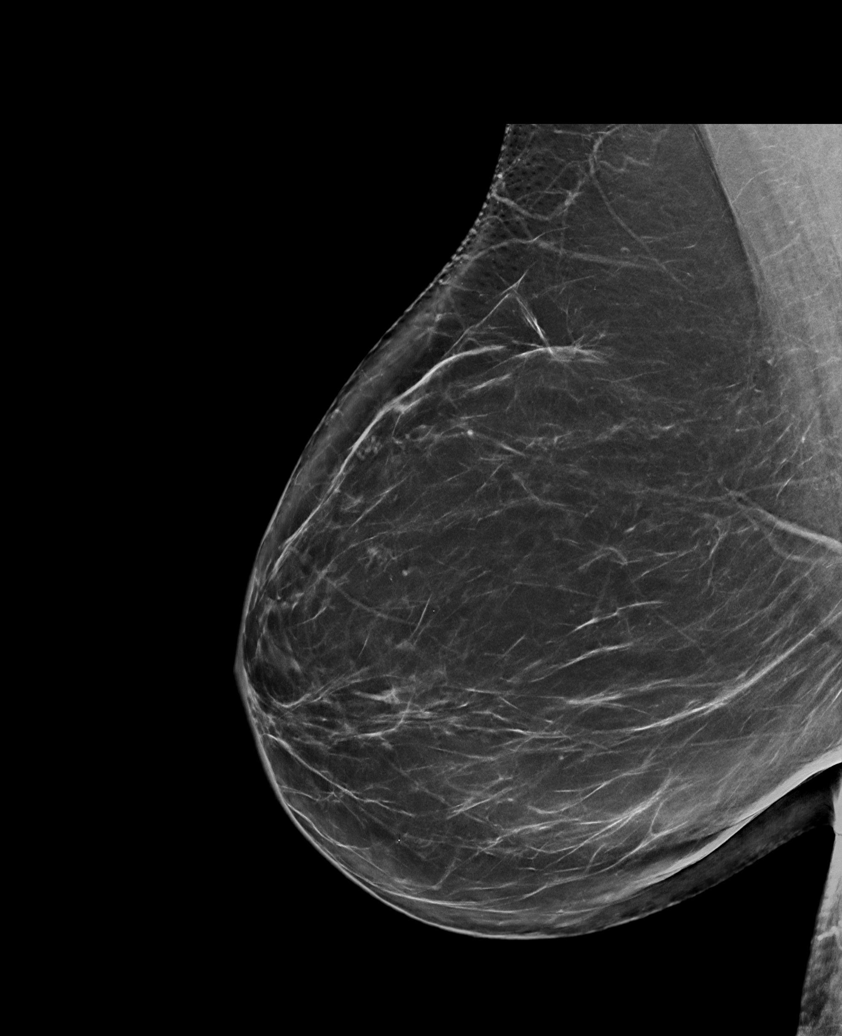

[R CC synth-2D (1 of 2)]
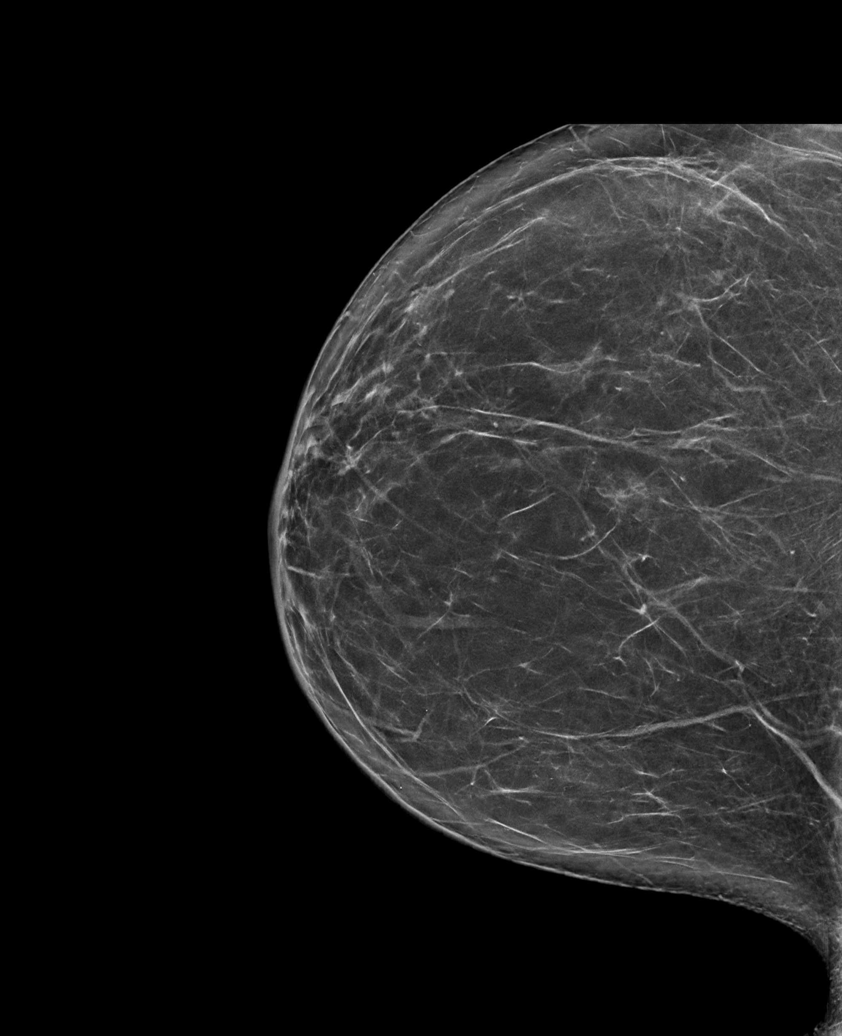

[R CC synth-2D (2 of 2)]
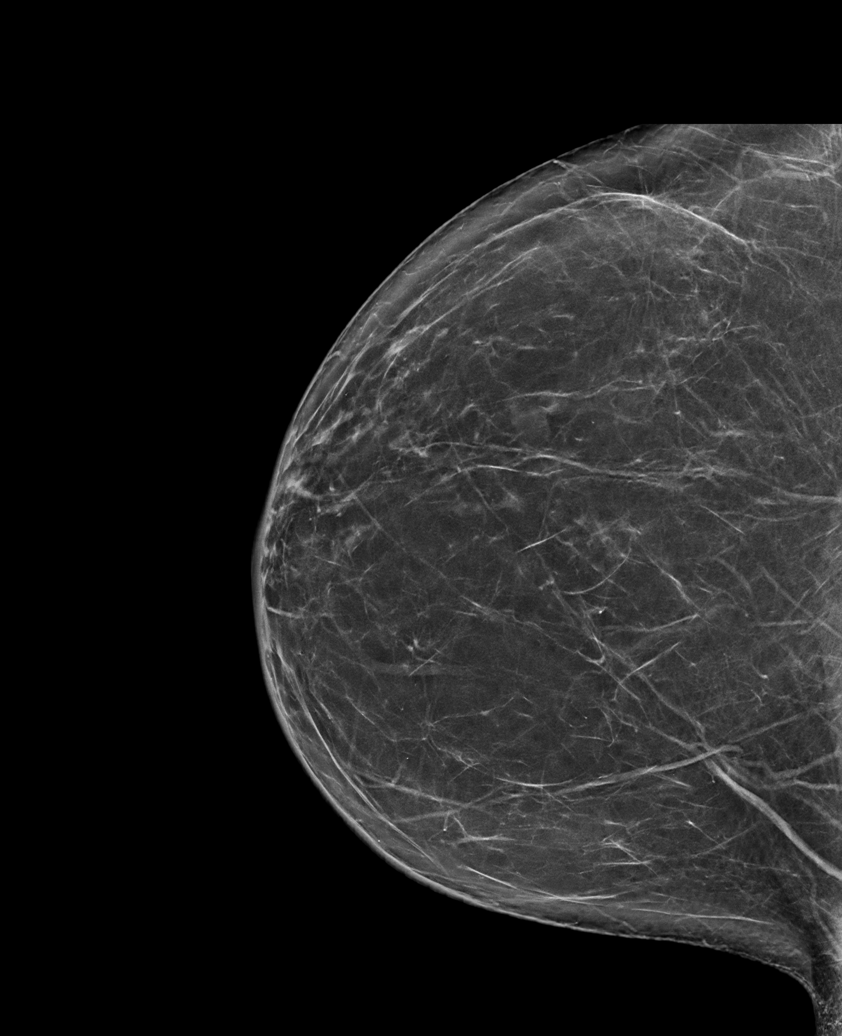

[L CC synth-2D]
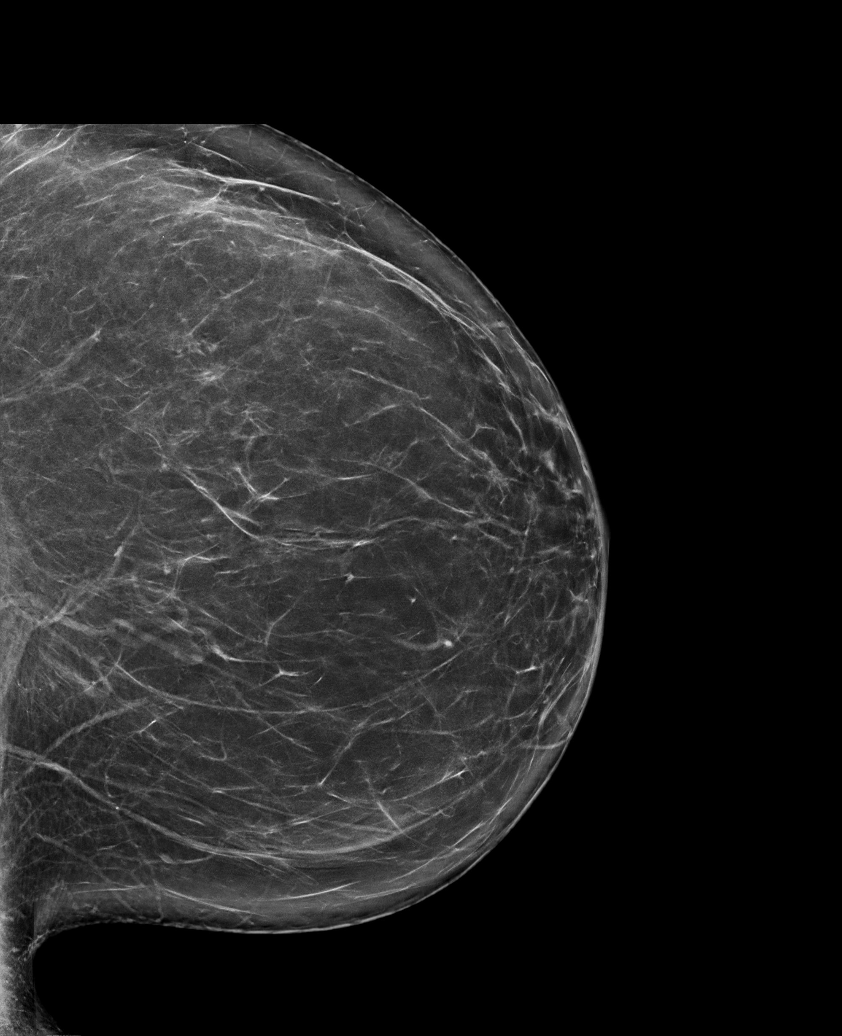

[L MLO synth-2D]
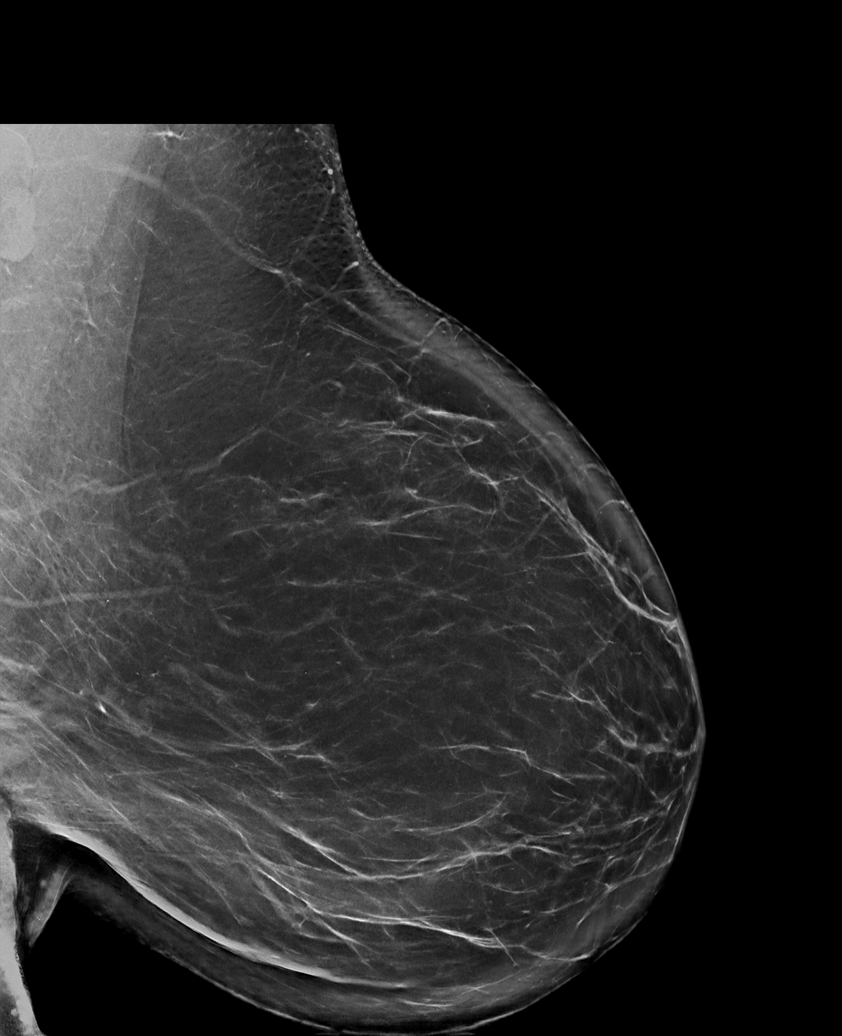

[L MLO tomo · tomo slice 52/103.0]
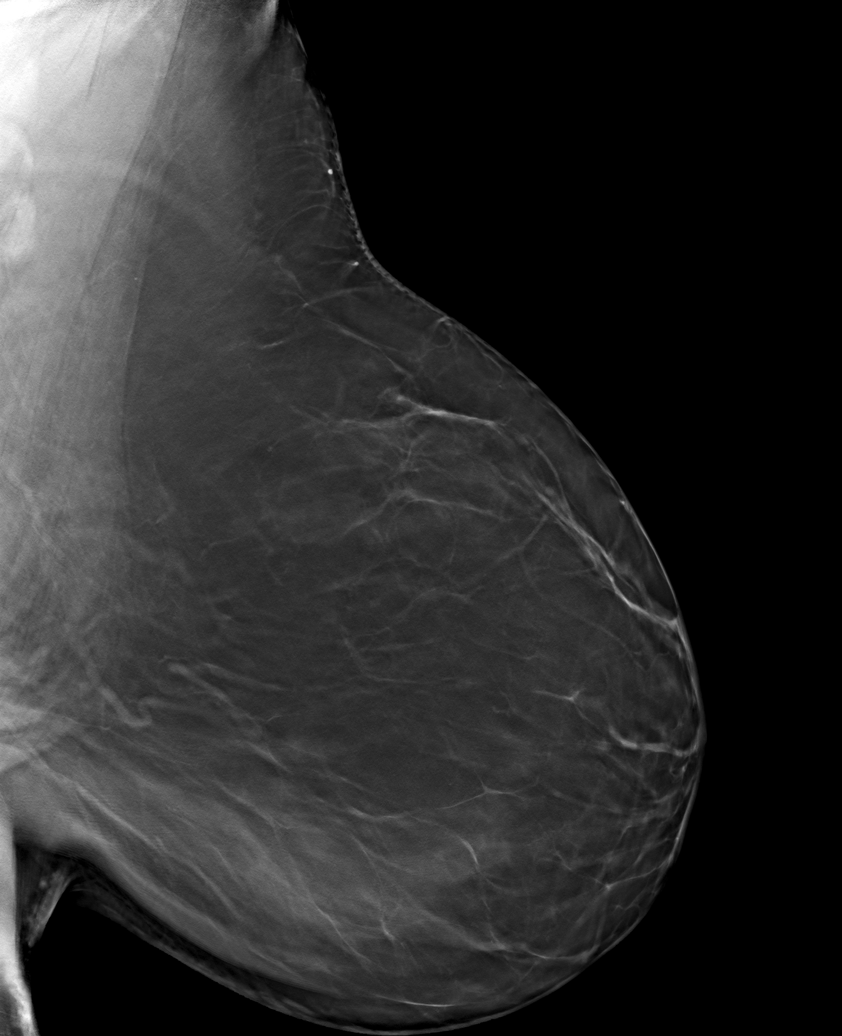

[6 of 30 positions shown; findings below may reference images not displayed]

ACR Breast Density Category b: There are scattered areas of
fibroglandular density.
FINDINGS: There are no findings suspicious for malignancy.
IMPRESSION: No mammographic evidence of malignancy. A result letter of this
screening mammogram will be mailed directly to the patient.

RECOMMENDATION:
Screening mammogram in one year. (Code:51-O-LD2)

BI-RADS CATEGORY  1: Negative.

## 2023-10-07 ENCOUNTER — Other Ambulatory Visit: Payer: Self-pay

## 2023-10-07 DIAGNOSIS — I493 Ventricular premature depolarization: Secondary | ICD-10-CM

## 2023-10-07 MED ORDER — DILTIAZEM HCL ER COATED BEADS 180 MG PO CP24
ORAL_CAPSULE | ORAL | 0 refills | Status: DC
Start: 2023-10-07 — End: 2024-02-27

## 2023-11-25 ENCOUNTER — Other Ambulatory Visit: Payer: Self-pay | Admitting: Cardiology

## 2023-11-25 DIAGNOSIS — I493 Ventricular premature depolarization: Secondary | ICD-10-CM

## 2023-12-12 ENCOUNTER — Ambulatory Visit: Payer: 59 | Admitting: Cardiology

## 2024-02-25 ENCOUNTER — Other Ambulatory Visit: Payer: Self-pay | Admitting: Cardiology

## 2024-02-25 DIAGNOSIS — I493 Ventricular premature depolarization: Secondary | ICD-10-CM
